# Patient Record
Sex: Female | Born: 1970 | Race: White | Hispanic: No | Marital: Married | Smoking: Former smoker
Health system: Southern US, Community
[De-identification: ages and names within clinical notes are randomized; demographics above are authoritative.]

## PROBLEM LIST (undated history)

## (undated) DIAGNOSIS — M1990 Unspecified osteoarthritis, unspecified site: Secondary | ICD-10-CM

## (undated) DIAGNOSIS — T7840XA Allergy, unspecified, initial encounter: Secondary | ICD-10-CM

## (undated) DIAGNOSIS — K589 Irritable bowel syndrome without diarrhea: Secondary | ICD-10-CM

## (undated) DIAGNOSIS — M3500 Sicca syndrome, unspecified: Secondary | ICD-10-CM

## (undated) DIAGNOSIS — F329 Major depressive disorder, single episode, unspecified: Secondary | ICD-10-CM

## (undated) DIAGNOSIS — E669 Obesity, unspecified: Secondary | ICD-10-CM

## (undated) DIAGNOSIS — F419 Anxiety disorder, unspecified: Secondary | ICD-10-CM

## (undated) DIAGNOSIS — K219 Gastro-esophageal reflux disease without esophagitis: Secondary | ICD-10-CM

## (undated) DIAGNOSIS — Z789 Other specified health status: Secondary | ICD-10-CM

## (undated) DIAGNOSIS — J45909 Unspecified asthma, uncomplicated: Secondary | ICD-10-CM

## (undated) DIAGNOSIS — G8929 Other chronic pain: Secondary | ICD-10-CM

## (undated) DIAGNOSIS — F32A Depression, unspecified: Secondary | ICD-10-CM

## (undated) DIAGNOSIS — R519 Headache, unspecified: Secondary | ICD-10-CM

## (undated) DIAGNOSIS — R51 Headache: Secondary | ICD-10-CM

## (undated) HISTORY — DX: Unspecified asthma, uncomplicated: J45.909

## (undated) HISTORY — DX: Headache: R51

## (undated) HISTORY — DX: Unspecified osteoarthritis, unspecified site: M19.90

## (undated) HISTORY — DX: Obesity, unspecified: E66.9

## (undated) HISTORY — PX: PLANTAR FASCIA SURGERY: SHX746

## (undated) HISTORY — DX: Headache, unspecified: R51.9

## (undated) HISTORY — DX: Other chronic pain: G89.29

## (undated) HISTORY — DX: Major depressive disorder, single episode, unspecified: F32.9

## (undated) HISTORY — DX: Gastro-esophageal reflux disease without esophagitis: K21.9

## (undated) HISTORY — DX: Sjogren syndrome, unspecified: M35.00

## (undated) HISTORY — DX: Anxiety disorder, unspecified: F41.9

## (undated) HISTORY — DX: Depression, unspecified: F32.A

## (undated) HISTORY — DX: Irritable bowel syndrome, unspecified: K58.9

## (undated) HISTORY — PX: WISDOM TOOTH EXTRACTION: SHX21

## (undated) HISTORY — DX: Allergy, unspecified, initial encounter: T78.40XA

---

## 1999-02-19 ENCOUNTER — Other Ambulatory Visit: Admission: RE | Admit: 1999-02-19 | Discharge: 1999-02-19 | Payer: Self-pay | Admitting: *Deleted

## 2003-09-10 ENCOUNTER — Other Ambulatory Visit: Admission: RE | Admit: 2003-09-10 | Discharge: 2003-09-10 | Payer: Self-pay | Admitting: Obstetrics and Gynecology

## 2004-12-21 HISTORY — PX: FOOT SURGERY: SHX648

## 2005-06-04 ENCOUNTER — Encounter: Admission: RE | Admit: 2005-06-04 | Discharge: 2005-06-04 | Payer: Self-pay | Admitting: Otolaryngology

## 2005-09-30 ENCOUNTER — Other Ambulatory Visit: Admission: RE | Admit: 2005-09-30 | Discharge: 2005-09-30 | Payer: Self-pay | Admitting: Obstetrics and Gynecology

## 2007-11-19 ENCOUNTER — Emergency Department (HOSPITAL_COMMUNITY): Admission: EM | Admit: 2007-11-19 | Discharge: 2007-11-19 | Payer: Self-pay | Admitting: Emergency Medicine

## 2008-04-25 ENCOUNTER — Encounter: Admission: RE | Admit: 2008-04-25 | Discharge: 2008-04-25 | Payer: Self-pay | Admitting: Specialist

## 2011-01-07 ENCOUNTER — Encounter
Admission: RE | Admit: 2011-01-07 | Discharge: 2011-01-07 | Payer: Self-pay | Source: Home / Self Care | Attending: Obstetrics and Gynecology | Admitting: Obstetrics and Gynecology

## 2011-01-07 IMAGING — MG MM DIGITAL DIAGNOSTIC UNILAT L
2 series · 2 of 2 positions shown · non-contrast
Comparison: [DATE]

CLINICAL DATA: Further evaluation of left asymmetry

DIGITAL DIAGNOSTIC LEFT MAMMOGRAM

[L CC]
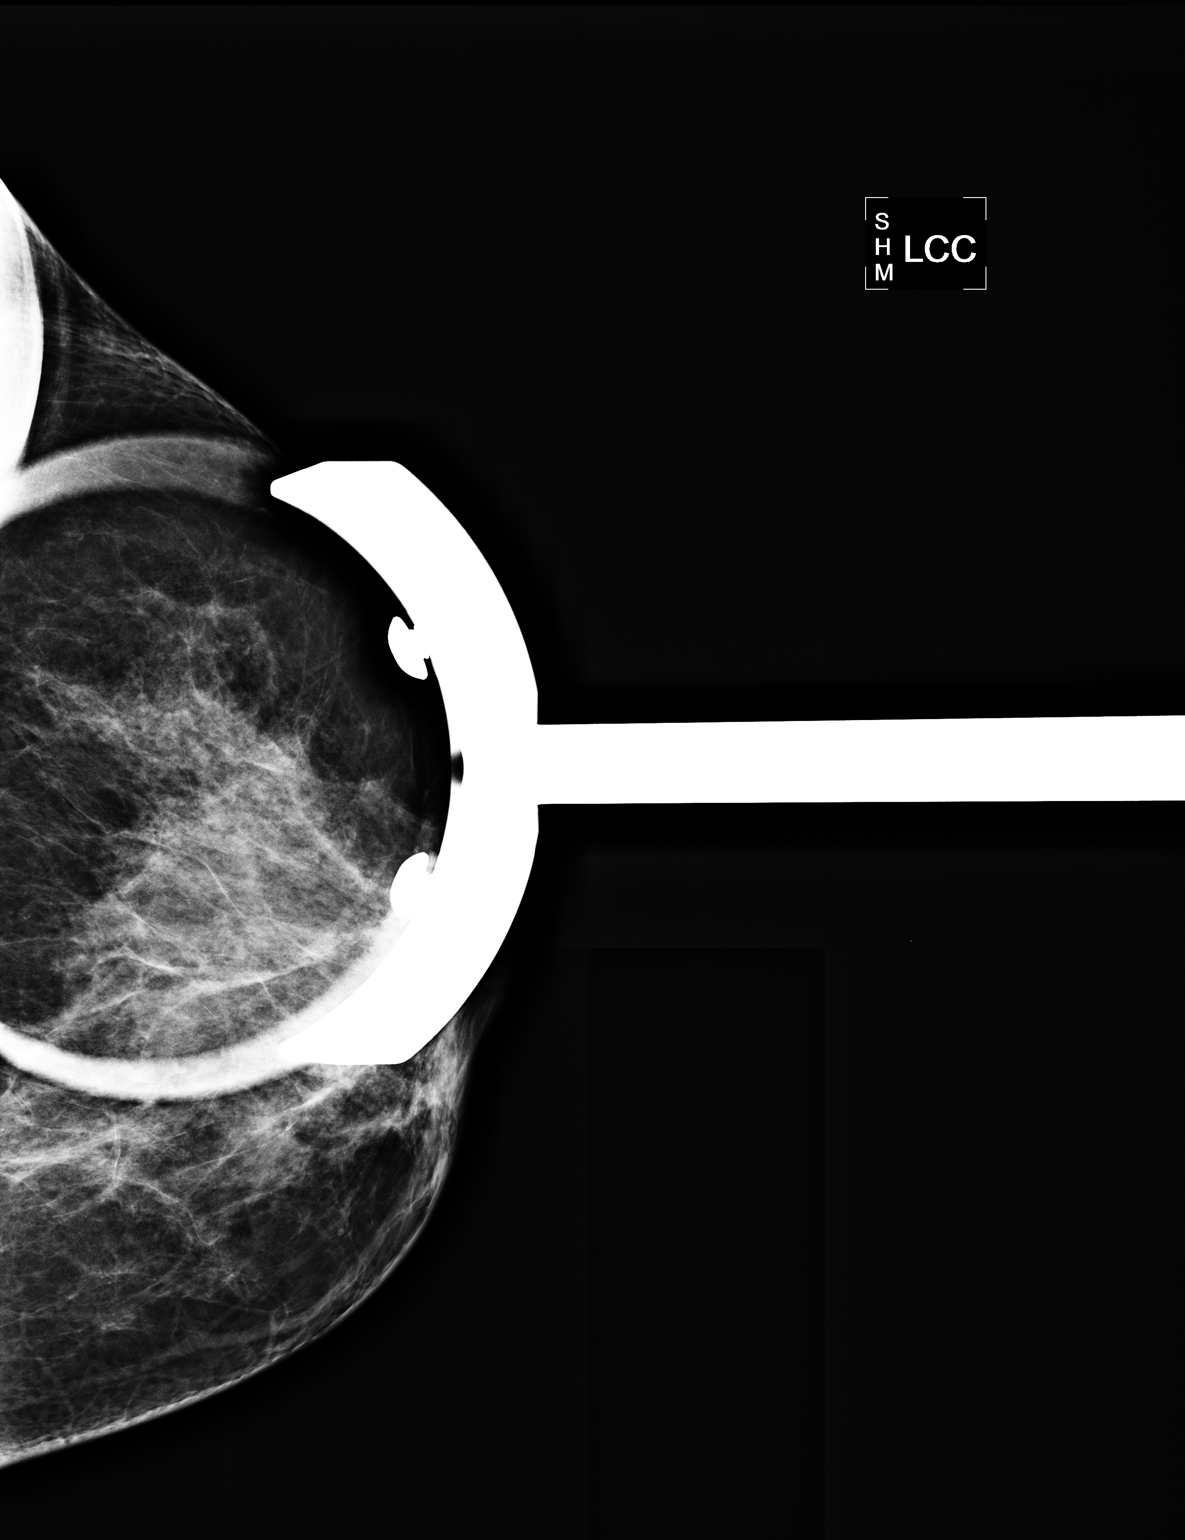

[L MLO]
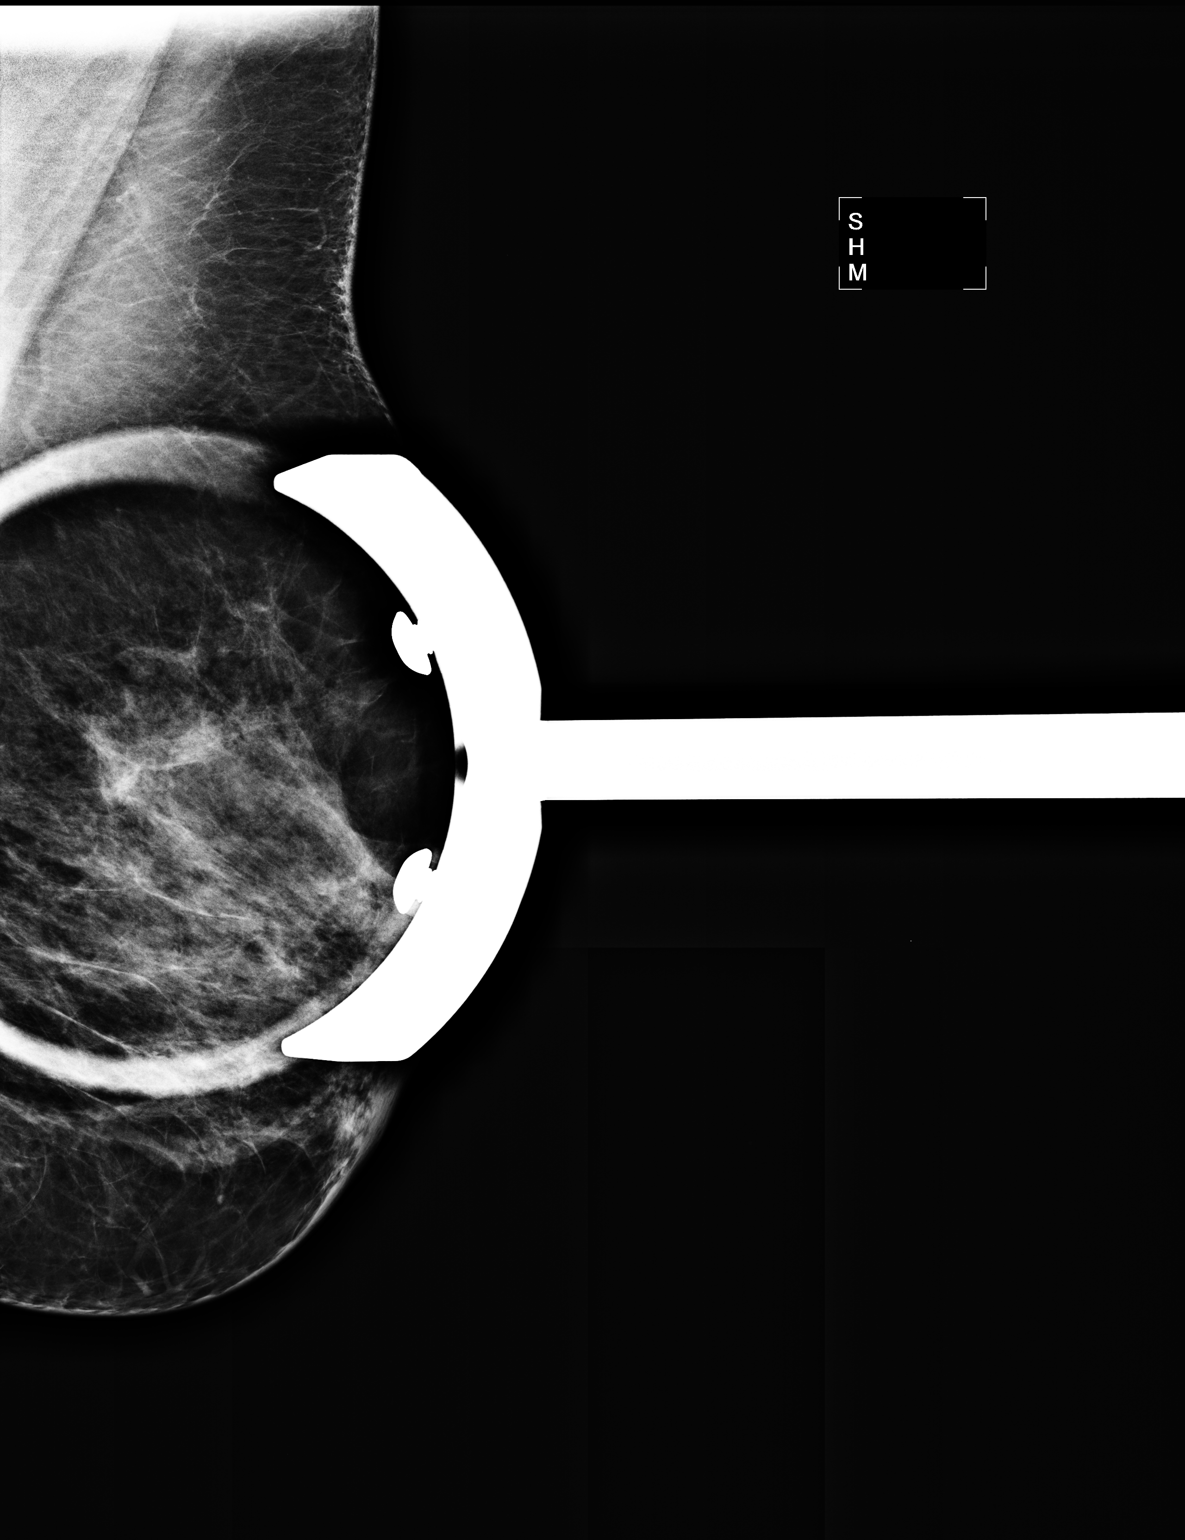

[2 of 2 positions shown; findings below may reference images not displayed]

FINDINGS: No persistent abnormality on spot compression views
IMPRESSION: Negative

BI-RADS CATEGORY 1:  Negative.

Recommendation:

Return to annual screening mammography.

## 2011-01-10 ENCOUNTER — Encounter: Payer: Self-pay | Admitting: Family Medicine

## 2011-01-21 ENCOUNTER — Encounter: Payer: Self-pay | Admitting: Obstetrics and Gynecology

## 2011-09-29 LAB — POCT CARDIAC MARKERS
CKMB, poc: <1 — ABNORMAL LOW
Myoglobin, poc: 54.4
Operator id: 151321

## 2011-09-29 LAB — CBC
HCT: 44.8
Hemoglobin: 15.1 — ABNORMAL HIGH
MCHC: 33.7
MCV: 90.6
Platelets: 294
RBC: 4.94
RDW: 13.8
WBC: 13 — ABNORMAL HIGH

## 2011-09-29 LAB — DIFFERENTIAL
Basophils Absolute: 0
Basophils Relative: 0
Eosinophils Relative: 1
Monocytes Absolute: 0.8

## 2011-09-29 LAB — I-STAT 8, (EC8 V) (CONVERTED LAB)
Acid-Base Excess: 1
Chloride: 103
TCO2: 28
pCO2, Ven: 43.4 — ABNORMAL LOW
pH, Ven: 7.397 — ABNORMAL HIGH

## 2011-09-29 LAB — POCT I-STAT CREATININE: Operator id: 151321

## 2014-04-11 ENCOUNTER — Observation Stay (HOSPITAL_COMMUNITY)
Admission: EM | Admit: 2014-04-11 | Discharge: 2014-04-14 | Disposition: A | Discharge status: Home / Self Care | Payer: BC Managed Care – PPO | Attending: Surgery | Admitting: Surgery

## 2014-04-11 ENCOUNTER — Encounter (HOSPITAL_COMMUNITY): Payer: Self-pay | Admitting: Emergency Medicine

## 2014-04-11 ENCOUNTER — Emergency Department (HOSPITAL_COMMUNITY)
Admission: EM | Admit: 2014-04-11 | Discharge: 2014-04-11 | Disposition: A | Discharge status: Nursing Home (Includes ICF) | Payer: BC Managed Care – PPO | Source: Home / Self Care | Attending: Emergency Medicine | Admitting: Emergency Medicine

## 2014-04-11 DIAGNOSIS — K810 Acute cholecystitis: Secondary | ICD-10-CM

## 2014-04-11 DIAGNOSIS — F17200 Nicotine dependence, unspecified, uncomplicated: Secondary | ICD-10-CM | POA: Insufficient documentation

## 2014-04-11 DIAGNOSIS — K8000 Calculus of gallbladder with acute cholecystitis without obstruction: Principal | ICD-10-CM | POA: Insufficient documentation

## 2014-04-11 DIAGNOSIS — R109 Unspecified abdominal pain: Secondary | ICD-10-CM

## 2014-04-11 DIAGNOSIS — K8010 Calculus of gallbladder with chronic cholecystitis without obstruction: Secondary | ICD-10-CM | POA: Diagnosis present

## 2014-04-11 HISTORY — DX: Other specified health status: Z78.9

## 2014-04-11 LAB — POCT PREGNANCY, URINE: PREG TEST UR: NEGATIVE

## 2014-04-11 LAB — CBC WITH DIFFERENTIAL/PLATELET
Basophils Absolute: 0.1 10*3/uL (ref 0.0–0.1)
Basophils Relative: 1 % (ref 0–1)
Eosinophils Absolute: 0.3 10*3/uL (ref 0.0–0.7)
Eosinophils Relative: 3 % (ref 0–5)
HEMATOCRIT: 43.7 % (ref 36.0–46.0)
HEMOGLOBIN: 15.1 g/dL — AB (ref 12.0–15.0)
LYMPHS ABS: 3 10*3/uL (ref 0.7–4.0)
LYMPHS PCT: 27 % (ref 12–46)
MCH: 31.5 pg (ref 26.0–34.0)
MCHC: 34.6 g/dL (ref 30.0–36.0)
MCV: 91.2 fL (ref 78.0–100.0)
MONO ABS: 0.7 10*3/uL (ref 0.1–1.0)
MONOS PCT: 6 % (ref 3–12)
NEUTROS ABS: 6.8 10*3/uL (ref 1.7–7.7)
Neutrophils Relative %: 63 % (ref 43–77)
Platelets: 264 10*3/uL (ref 150–400)
RBC: 4.79 MIL/uL (ref 3.87–5.11)
RDW: 13.1 % (ref 11.5–15.5)
WBC: 10.8 10*3/uL — AB (ref 4.0–10.5)

## 2014-04-11 LAB — POCT URINALYSIS DIP (DEVICE)
Bilirubin Urine: NEGATIVE
GLUCOSE, UA: NEGATIVE mg/dL
HGB URINE DIPSTICK: NEGATIVE
KETONES UR: NEGATIVE mg/dL
Leukocytes, UA: NEGATIVE
Nitrite: NEGATIVE
PROTEIN: NEGATIVE mg/dL
SPECIFIC GRAVITY, URINE: 1.01 (ref 1.005–1.030)
Urobilinogen, UA: 0.2 mg/dL (ref 0.0–1.0)
pH: 6 (ref 5.0–8.0)

## 2014-04-11 LAB — COMPREHENSIVE METABOLIC PANEL
ALT: 30 U/L (ref 0–35)
AST: 20 U/L (ref 0–37)
Albumin: 4.2 g/dL (ref 3.5–5.2)
Alkaline Phosphatase: 71 U/L (ref 39–117)
BILIRUBIN TOTAL: 0.3 mg/dL (ref 0.3–1.2)
BUN: 12 mg/dL (ref 6–23)
CHLORIDE: 102 meq/L (ref 96–112)
CO2: 24 meq/L (ref 19–32)
CREATININE: 0.84 mg/dL (ref 0.50–1.10)
Calcium: 9.5 mg/dL (ref 8.4–10.5)
GFR, EST NON AFRICAN AMERICAN: 83 mL/min — AB (ref >90)
GLUCOSE: 89 mg/dL (ref 70–99)
Potassium: 4.4 mEq/L (ref 3.7–5.3)
Sodium: 140 mEq/L (ref 137–147)
Total Protein: 7.6 g/dL (ref 6.0–8.3)

## 2014-04-11 LAB — LIPASE, BLOOD: LIPASE: 63 U/L — AB (ref 11–59)

## 2014-04-11 NOTE — ED Notes (Signed)
C/o RUQ abdominal pain that woke her up from sleep @ 0500.  Pain 8/10. Has had nausea all day but no vomiting.  Has had diarrhea off and on for 2-3 mos., after eating.  States she has 2-3 loose stools/day 5 out of 7 days /week.  No PCP.  States her stools are a light brown- or reddish clay color.  Took Maalox 2 tbls. without relief.  The sharp pain lasted 3 hrs. and now feels aching and bloating.

## 2014-04-11 NOTE — ED Notes (Signed)
Pt. 's husband is coming but not here yet.  Will direct him to the ED.

## 2014-04-11 NOTE — ED Notes (Signed)
Pt. reports generalized abdominal pain with diarrhea , lower back and nausea onset today , no fever or chills, urinalysis / preg test done at [facility] urgent care this evening .

## 2014-04-11 NOTE — ED Provider Notes (Signed)
CSN: [id number]     Arrival date & time 04/11/14  1855 History   First MD Initiated Contact with Patient 04/11/14 2003     Chief Complaint  Patient presents with  . Abdominal Pain   (Consider location/radiation/quality/duration/timing/severity/associated sxs/prior Treatment) Patient is a 43 y.o. female presenting with abdominal pain. The history is provided by the patient.  Abdominal Pain Pain location:  RUQ Pain quality: sharp   Pain quality comment:  Patient states discomofrt woke her from sleep this morning. Pain radiates to:  Back Pain severity:  Moderate Onset quality:  Sudden Duration:  1 day Timing:  Constant Chronicity:  New Context: awakening from sleep   Context: not alcohol use, not diet changes, not eating, not previous surgeries, not recent illness, not recent travel, not suspicious food intake and not trauma   Relieved by:  Nothing Worsened by:  Eating Associated symptoms: anorexia, diarrhea and nausea   Associated symptoms: no belching, no chills, no constipation, no cough, no dysuria, no fatigue, no fever, no flatus, no hematemesis, no hematochezia, no hematuria, no melena, no shortness of breath, no sore throat, no vaginal bleeding, no vaginal discharge and no vomiting     History reviewed. No pertinent past medical history. Past Surgical History  Procedure Laterality Date  . Foot surgery Left 2006    fascia tendon clipped   History reviewed. No pertinent family history. History  Substance Use Topics  . Smoking status: Current Every Day Smoker -- 0.50 packs/day    Types: Cigarettes  . Smokeless tobacco: Not on file  . Alcohol Use: 2.4 oz/week    4 Shots of liquor per week   OB History   Grav Para Term Preterm Abortions TAB SAB Ect Mult Living                 Review of Systems  Constitutional: Positive for appetite change. Negative for fever, chills and fatigue.  HENT: Negative.  Negative for sore throat.   Eyes: Negative.   Respiratory: Negative for  cough, chest tightness and shortness of breath.   Cardiovascular: Negative.   Gastrointestinal: Positive for nausea, abdominal pain, diarrhea, abdominal distention and anorexia. Negative for vomiting, constipation, blood in stool, melena, hematochezia, rectal pain, flatus and hematemesis.  Genitourinary: Negative.  Negative for dysuria, hematuria, vaginal bleeding and vaginal discharge.  Musculoskeletal: Positive for back pain.  Skin: Negative.   Neurological: Negative.     Allergies  Review of patient's allergies indicates no known allergies.  Home Medications   Prior to Admission medications   Not on File   BP 113/77  Pulse 80  Temp(Src) 98.5 F (36.9 C) (Oral)  Resp 19  SpO2 96%  LMP 02/23/2014 Physical Exam  Nursing note and vitals reviewed. Constitutional: She is oriented to person, place, and time. She appears well-developed and well-nourished. No distress.  HENT:  Head: Normocephalic and atraumatic.  Eyes: Conjunctivae are normal. No scleral icterus.  Cardiovascular: Normal rate, regular rhythm and normal heart sounds.   Pulmonary/Chest: Effort normal and breath sounds normal.  Abdominal: Soft. Normal appearance. Bowel sounds are decreased. There is no hepatosplenomegaly. There is tenderness in the right upper quadrant. There is positive Murphy's sign. There is no rigidity, no rebound, no guarding, no CVA tenderness and no tenderness at McBurney's point.  Musculoskeletal: Normal range of motion.  Neurological: She is alert and oriented to person, place, and time.  Skin: Skin is warm and dry. No rash noted.  Psychiatric: She has a normal mood and affect. Her  behavior is normal.    ED Course  Procedures (including critical care time) Labs Review Labs Reviewed  POCT URINALYSIS DIP (DEVICE)  POCT PREGNANCY, URINE   Imaging Review No results found.   MDM   1. Abdominal pain    Exam suggestive of acute cholelithiasis/cholecystitis. UA normal and UPT negative.  Will transfer to [facility] ER for possible abdominal U/S imaging and further evaluation.     [place], [place] 04/11/14 [phone]

## 2014-04-12 ENCOUNTER — Encounter (HOSPITAL_COMMUNITY)
Admission: EM | Disposition: A | Discharge status: Home / Self Care | Payer: Self-pay | Source: Home / Self Care | Attending: Emergency Medicine

## 2014-04-12 ENCOUNTER — Observation Stay (HOSPITAL_COMMUNITY): Payer: BC Managed Care – PPO | Admitting: Anesthesiology

## 2014-04-12 ENCOUNTER — Encounter (HOSPITAL_COMMUNITY): Payer: BC Managed Care – PPO | Admitting: Anesthesiology

## 2014-04-12 ENCOUNTER — Encounter (HOSPITAL_COMMUNITY): Payer: Self-pay | Admitting: *Deleted

## 2014-04-12 ENCOUNTER — Emergency Department (HOSPITAL_COMMUNITY): Payer: BC Managed Care – PPO

## 2014-04-12 DIAGNOSIS — K8010 Calculus of gallbladder with chronic cholecystitis without obstruction: Secondary | ICD-10-CM | POA: Diagnosis present

## 2014-04-12 DIAGNOSIS — K8020 Calculus of gallbladder without cholecystitis without obstruction: Secondary | ICD-10-CM

## 2014-04-12 HISTORY — PX: CHOLECYSTECTOMY: SHX55

## 2014-04-12 LAB — SURGICAL PCR SCREEN
MRSA, PCR: NEGATIVE
Staphylococcus aureus: NEGATIVE

## 2014-04-12 SURGERY — LAPAROSCOPIC CHOLECYSTECTOMY WITH INTRAOPERATIVE CHOLANGIOGRAM
Anesthesia: General | Site: Abdomen

## 2014-04-12 MED ORDER — LIDOCAINE HCL (CARDIAC) 20 MG/ML IV SOLN
INTRAVENOUS | Status: DC | PRN
  Administered 2014-04-12: 50 mg via INTRAVENOUS

## 2014-04-12 MED ORDER — BUPIVACAINE-EPINEPHRINE (PF) 0.25% -1:200000 IJ SOLN
INTRAMUSCULAR | Status: AC
  Filled 2014-04-12: qty 30

## 2014-04-12 MED ORDER — ARTIFICIAL TEARS OP OINT
TOPICAL_OINTMENT | OPHTHALMIC | Status: AC
  Filled 2014-04-12: qty 3.5

## 2014-04-12 MED ORDER — DEXAMETHASONE SODIUM PHOSPHATE 4 MG/ML IJ SOLN
INTRAMUSCULAR | Status: DC | PRN
  Administered 2014-04-12: 8 mg via INTRAVENOUS

## 2014-04-12 MED ORDER — CEFAZOLIN SODIUM-DEXTROSE 2-3 GM-% IV SOLR
INTRAVENOUS | Status: DC | PRN
  Administered 2014-04-12: 2 g via INTRAVENOUS

## 2014-04-12 MED ORDER — OXYCODONE-ACETAMINOPHEN 5-325 MG PO TABS
1.0000 | ORAL_TABLET | ORAL | Status: DC | PRN
  Administered 2014-04-13 – 2014-04-14 (×5): 2 via ORAL
  Filled 2014-04-12 (×5): qty 2

## 2014-04-12 MED ORDER — ARTIFICIAL TEARS OP OINT
TOPICAL_OINTMENT | OPHTHALMIC | Status: DC | PRN
  Administered 2014-04-12: 1 via OPHTHALMIC

## 2014-04-12 MED ORDER — ONDANSETRON HCL 4 MG/2ML IJ SOLN
4.0000 mg | Freq: Four times a day (QID) | INTRAMUSCULAR | Status: DC | PRN
  Administered 2014-04-12: 4 mg via INTRAVENOUS
  Filled 2014-04-12: qty 2

## 2014-04-12 MED ORDER — KETOROLAC TROMETHAMINE 30 MG/ML IJ SOLN
INTRAMUSCULAR | Status: DC | PRN
  Administered 2014-04-12: 30 mg via INTRAVENOUS

## 2014-04-12 MED ORDER — FENTANYL CITRATE 0.05 MG/ML IJ SOLN
INTRAMUSCULAR | Status: AC
  Filled 2014-04-12: qty 5

## 2014-04-12 MED ORDER — ROCURONIUM BROMIDE 50 MG/5ML IV SOLN
INTRAVENOUS | Status: AC
  Filled 2014-04-12: qty 1

## 2014-04-12 MED ORDER — MIDAZOLAM HCL 2 MG/2ML IJ SOLN
INTRAMUSCULAR | Status: AC
  Filled 2014-04-12: qty 2

## 2014-04-12 MED ORDER — PROPOFOL 10 MG/ML IV BOLUS
INTRAVENOUS | Status: DC | PRN
  Administered 2014-04-12: 150 mg via INTRAVENOUS
  Administered 2014-04-12: 60 mg via INTRAVENOUS

## 2014-04-12 MED ORDER — OXYCODONE HCL 5 MG PO TABS: 5.0000 mg | ORAL_TABLET | Freq: Once | ORAL | Status: DC | PRN

## 2014-04-12 MED ORDER — LACTATED RINGERS IV SOLN
INTRAVENOUS | Status: DC | PRN
  Administered 2014-04-12 (×2): via INTRAVENOUS

## 2014-04-12 MED ORDER — HYDROMORPHONE HCL PF 1 MG/ML IJ SOLN
1.0000 mg | Freq: Once | INTRAMUSCULAR | Status: AC
  Administered 2014-04-12: 1 mg via INTRAMUSCULAR
  Filled 2014-04-12: qty 1

## 2014-04-12 MED ORDER — PROPOFOL 10 MG/ML IV BOLUS
INTRAVENOUS | Status: AC
  Filled 2014-04-12: qty 20

## 2014-04-12 MED ORDER — OXYCODONE HCL 5 MG/5ML PO SOLN: 5.0000 mg | Freq: Once | ORAL | Status: DC | PRN

## 2014-04-12 MED ORDER — BUPIVACAINE-EPINEPHRINE 0.25% -1:200000 IJ SOLN
INTRAMUSCULAR | Status: DC | PRN
  Administered 2014-04-12: 20 mL

## 2014-04-12 MED ORDER — HYDROMORPHONE HCL PF 1 MG/ML IJ SOLN
0.2500 mg | INTRAMUSCULAR | Status: DC | PRN
  Administered 2014-04-12 (×2): 0.5 mg via INTRAVENOUS

## 2014-04-12 MED ORDER — GLYCOPYRROLATE 0.2 MG/ML IJ SOLN
INTRAMUSCULAR | Status: AC
  Filled 2014-04-12: qty 2

## 2014-04-12 MED ORDER — MIDAZOLAM HCL 5 MG/5ML IJ SOLN
INTRAMUSCULAR | Status: DC | PRN
  Administered 2014-04-12: 2 mg via INTRAVENOUS

## 2014-04-12 MED ORDER — FENTANYL CITRATE 0.05 MG/ML IJ SOLN
INTRAMUSCULAR | Status: DC | PRN
  Administered 2014-04-12 (×3): 50 ug via INTRAVENOUS
  Administered 2014-04-12: 100 ug via INTRAVENOUS

## 2014-04-12 MED ORDER — ONDANSETRON HCL 4 MG/2ML IJ SOLN
INTRAMUSCULAR | Status: DC | PRN
  Administered 2014-04-12: 4 mg via INTRAVENOUS

## 2014-04-12 MED ORDER — PANTOPRAZOLE SODIUM 40 MG IV SOLR
40.0000 mg | Freq: Every day | INTRAVENOUS | Status: DC
  Administered 2014-04-12 – 2014-04-13 (×2): 40 mg via INTRAVENOUS
  Filled 2014-04-12 (×4): qty 40

## 2014-04-12 MED ORDER — MORPHINE SULFATE 2 MG/ML IJ SOLN
2.0000 mg | INTRAMUSCULAR | Status: DC | PRN
  Administered 2014-04-12 (×2): 2 mg via INTRAVENOUS
  Filled 2014-04-12 (×2): qty 1

## 2014-04-12 MED ORDER — KCL IN DEXTROSE-NACL 20-5-0.9 MEQ/L-%-% IV SOLN
INTRAVENOUS | Status: DC
  Administered 2014-04-12 – 2014-04-14 (×3): via INTRAVENOUS
  Filled 2014-04-12 (×4): qty 1000

## 2014-04-12 MED ORDER — SODIUM CHLORIDE 0.9 % IR SOLN
Status: DC | PRN
  Administered 2014-04-12: 1

## 2014-04-12 MED ORDER — MORPHINE SULFATE 2 MG/ML IJ SOLN
1.0000 mg | INTRAMUSCULAR | Status: DC | PRN
  Administered 2014-04-12 (×2): 4 mg via INTRAVENOUS
  Administered 2014-04-13 (×2): 2 mg via INTRAVENOUS
  Filled 2014-04-12: qty 1
  Filled 2014-04-12 (×2): qty 2
  Filled 2014-04-12: qty 1

## 2014-04-12 MED ORDER — ONDANSETRON HCL 4 MG/2ML IJ SOLN
INTRAMUSCULAR | Status: AC
  Filled 2014-04-12: qty 2

## 2014-04-12 MED ORDER — SUCCINYLCHOLINE CHLORIDE 20 MG/ML IJ SOLN
INTRAMUSCULAR | Status: DC | PRN
  Administered 2014-04-12: 120 mg via INTRAVENOUS

## 2014-04-12 MED ORDER — HYDROMORPHONE HCL PF 1 MG/ML IJ SOLN
INTRAMUSCULAR | Status: AC
  Filled 2014-04-12: qty 1

## 2014-04-12 MED ORDER — KCL IN DEXTROSE-NACL 20-5-0.9 MEQ/L-%-% IV SOLN
INTRAVENOUS | Status: DC
  Administered 2014-04-12: 04:00:00 via INTRAVENOUS
  Filled 2014-04-12 (×3): qty 1000

## 2014-04-12 MED ORDER — NEOSTIGMINE METHYLSULFATE 1 MG/ML IJ SOLN
INTRAMUSCULAR | Status: AC
  Filled 2014-04-12: qty 10

## 2014-04-12 MED ORDER — 0.9 % SODIUM CHLORIDE (POUR BTL) OPTIME
TOPICAL | Status: DC | PRN
Start: 2014-04-12 — End: 2014-04-12
  Administered 2014-04-12: 1000 mL

## 2014-04-12 MED ORDER — LACTATED RINGERS IV SOLN
INTRAVENOUS | Status: DC
  Administered 2014-04-12: 13:00:00 via INTRAVENOUS

## 2014-04-12 SURGICAL SUPPLY — 47 items
APL SKNCLS STERI-STRIP NONHPOA (GAUZE/BANDAGES/DRESSINGS) ×1
APPLIER CLIP 5 13 M/L LIGAMAX5 (MISCELLANEOUS) ×3
APR CLP MED LRG 5 ANG JAW (MISCELLANEOUS) ×1
BAG SPEC RTRVL LRG 6X4 10 (ENDOMECHANICALS) ×1
BANDAGE ADHESIVE 1X3 (GAUZE/BANDAGES/DRESSINGS) ×12 IMPLANT
BENZOIN TINCTURE PRP APPL 2/3 (GAUZE/BANDAGES/DRESSINGS) ×3 IMPLANT
BNDG ADH 5X3 H2O RPLNT NS (GAUZE/BANDAGES/DRESSINGS) ×1
BNDG COHESIVE 3X5 WHT NS (GAUZE/BANDAGES/DRESSINGS) ×2 IMPLANT
CANISTER SUCTION 2500CC (MISCELLANEOUS) ×3 IMPLANT
CHLORAPREP W/TINT 26ML (MISCELLANEOUS) ×3 IMPLANT
CLIP APPLIE 5 13 M/L LIGAMAX5 (MISCELLANEOUS) ×1 IMPLANT
CLOSURE STERI-STRIP 1/2X4 (GAUZE/BANDAGES/DRESSINGS) ×1
CLSR STERI-STRIP ANTIMIC 1/2X4 (GAUZE/BANDAGES/DRESSINGS) ×1 IMPLANT
COVER MAYO STAND STRL (DRAPES) IMPLANT
COVER SURGICAL LIGHT HANDLE (MISCELLANEOUS) ×3 IMPLANT
DECANTER SPIKE VIAL GLASS SM (MISCELLANEOUS) ×3 IMPLANT
DRAPE C-ARM 42X72 X-RAY (DRAPES) IMPLANT
DRAPE UTILITY 15X26 W/TAPE STR (DRAPE) ×6 IMPLANT
ELECT REM PT RETURN 9FT ADLT (ELECTROSURGICAL) ×3
ELECTRODE REM PT RTRN 9FT ADLT (ELECTROSURGICAL) ×1 IMPLANT
GLOVE BIO SURGEON STRL SZ7.5 (GLOVE) ×2 IMPLANT
GLOVE BIOGEL PI IND STRL 7.0 (GLOVE) IMPLANT
GLOVE BIOGEL PI IND STRL 7.5 (GLOVE) IMPLANT
GLOVE BIOGEL PI INDICATOR 7.0 (GLOVE) ×2
GLOVE BIOGEL PI INDICATOR 7.5 (GLOVE) ×2
GLOVE SURG SIGNA 7.5 PF LTX (GLOVE) ×3 IMPLANT
GLOVE SURG SS PI 7.0 STRL IVOR (GLOVE) ×2 IMPLANT
GOWN STRL REUS W/ TWL LRG LVL3 (GOWN DISPOSABLE) ×3 IMPLANT
GOWN STRL REUS W/ TWL XL LVL3 (GOWN DISPOSABLE) ×1 IMPLANT
GOWN STRL REUS W/TWL LRG LVL3 (GOWN DISPOSABLE) ×9
GOWN STRL REUS W/TWL XL LVL3 (GOWN DISPOSABLE) ×3
KIT BASIN OR (CUSTOM PROCEDURE TRAY) ×3 IMPLANT
KIT ROOM TURNOVER OR (KITS) ×3 IMPLANT
NS IRRIG 1000ML POUR BTL (IV SOLUTION) ×3 IMPLANT
PAD ARMBOARD 7.5X6 YLW CONV (MISCELLANEOUS) ×3 IMPLANT
POUCH SPECIMEN RETRIEVAL 10MM (ENDOMECHANICALS) ×2 IMPLANT
SCISSORS LAP 5X35 DISP (ENDOMECHANICALS) ×3 IMPLANT
SET CHOLANGIOGRAPH 5 50 .035 (SET/KITS/TRAYS/PACK) IMPLANT
SET IRRIG TUBING LAPAROSCOPIC (IRRIGATION / IRRIGATOR) ×3 IMPLANT
SLEEVE ENDOPATH XCEL 5M (ENDOMECHANICALS) ×6 IMPLANT
SPECIMEN JAR SMALL (MISCELLANEOUS) ×3 IMPLANT
SUT MON AB 4-0 PC3 18 (SUTURE) ×3 IMPLANT
TOWEL OR 17X24 6PK STRL BLUE (TOWEL DISPOSABLE) ×3 IMPLANT
TOWEL OR 17X26 10 PK STRL BLUE (TOWEL DISPOSABLE) ×3 IMPLANT
TRAY LAPAROSCOPIC (CUSTOM PROCEDURE TRAY) ×3 IMPLANT
TROCAR XCEL BLUNT TIP 100MML (ENDOMECHANICALS) ×3 IMPLANT
TROCAR XCEL NON-BLD 5MMX100MML (ENDOMECHANICALS) ×3 IMPLANT

## 2014-04-12 NOTE — Progress Notes (Signed)
Patient ID: Kerri Rodriguez, female   DOB: Dec 10, 1971, 43 y.o.   MRN: [id number]  Patient stable, cholelithiasis with cholecystitis  Plan lap chole with possible cholangiogram today  I discussed the risks which include but are not limited to bleeding, infection, injury to surrounding structures including the bile duct, bile leak, need to convert to an open procedure, etc. I also discussed post op recovery.  She understands and agrees to proceed.

## 2014-04-12 NOTE — Transfer of Care (Signed)
Immediate Anesthesia Transfer of Care Note  Patient: Kerri Rodriguez  Procedure(s) Performed: Procedure(s): LAPAROSCOPIC CHOLECYSTECTOMY (N/A)  Patient Location: PACU  Anesthesia Type:General  Level of Consciousness: awake, alert  and oriented  Airway & Oxygen Therapy: Patient Spontanous Breathing and Patient connected to face mask oxygen  Post-op Assessment: Report given to PACU RN  Post vital signs: Reviewed and stable  Complications: No apparent anesthesia complications

## 2014-04-12 NOTE — H&P (Signed)
Kerri Rodriguez is an 43 y.o. female.   Chief Complaint: abdominal pain HPI:  The pt is a 43 yo wf who presents with RUQ pain. She reports mild pain for the last 2 weeks. Pain became severe Wednesday morning. No nausea or vomiting. No fever.  History reviewed. No pertinent past medical history.  Past Surgical History  Procedure Laterality Date  . Foot surgery Left 2006    fascia tendon clipped    No family history on file. Social History:  reports that she has been smoking Cigarettes.  She has been smoking about 0.50 packs per day. She does not have any smokeless tobacco history on file. She reports that she drinks about 2.4 ounces of alcohol per week. She reports that she does not use illicit drugs.  Allergies: No Known Allergies   (Not in a hospital admission)  Results for orders placed during the hospital encounter of 04/11/14 (from the past 48 hour(s))  CBC WITH DIFFERENTIAL     Status: Abnormal   Collection Time    04/11/14  9:02 PM      Result Value Ref Range   WBC 10.8 (*) 4.0 - 10.5 K/uL   RBC 4.79  3.87 - 5.11 MIL/uL   Hemoglobin 15.1 (*) 12.0 - 15.0 g/dL   HCT 43.7  36.0 - 46.0 %   MCV 91.2  78.0 - 100.0 fL   MCH 31.5  26.0 - 34.0 pg   MCHC 34.6  30.0 - 36.0 g/dL   RDW 13.1  11.5 - 15.5 %   Platelets 264  150 - 400 K/uL   Neutrophils Relative % 63  43 - 77 %   Neutro Abs 6.8  1.7 - 7.7 K/uL   Lymphocytes Relative 27  12 - 46 %   Lymphs Abs 3.0  0.7 - 4.0 K/uL   Monocytes Relative 6  3 - 12 %   Monocytes Absolute 0.7  0.1 - 1.0 K/uL   Eosinophils Relative 3  0 - 5 %   Eosinophils Absolute 0.3  0.0 - 0.7 K/uL   Basophils Relative 1  0 - 1 %   Basophils Absolute 0.1  0.0 - 0.1 K/uL  COMPREHENSIVE METABOLIC PANEL     Status: Abnormal   Collection Time    04/11/14  9:02 PM      Result Value Ref Range   Sodium 140  137 - 147 mEq/L   Potassium 4.4  3.7 - 5.3 mEq/L   Chloride 102  96 - 112 mEq/L   CO2 24  19 - 32 mEq/L   Glucose, Bld 89  70 - 99 mg/dL   BUN 12   6 - 23 mg/dL   Creatinine, Ser 0.84  0.50 - 1.10 mg/dL   Calcium 9.5  8.4 - 10.5 mg/dL   Total Protein 7.6  6.0 - 8.3 g/dL   Albumin 4.2  3.5 - 5.2 g/dL   AST 20  0 - 37 U/L   Comment: HEMOLYSIS AT THIS LEVEL MAY AFFECT RESULT   ALT 30  0 - 35 U/L   Alkaline Phosphatase 71  39 - 117 U/L   Total Bilirubin 0.3  0.3 - 1.2 mg/dL   GFR calc non Af Amer 83 (*) >90 mL/min   GFR calc Af Amer >90  >90 mL/min   Comment: (NOTE)     The eGFR has been calculated using the CKD EPI equation.     This calculation has not been validated in all clinical situations.  eGFR's persistently <90 mL/min signify possible Chronic Kidney     Disease.  LIPASE, BLOOD     Status: Abnormal   Collection Time    04/11/14  9:02 PM      Result Value Ref Range   Lipase 63 (*) 11 - 59 U/L   US Abdomen Limited  04/12/2014   CLINICAL DATA:  Abdominal pain  EXAM: US ABDOMEN LIMITED - RIGHT UPPER QUADRANT  COMPARISON:  None.  FINDINGS: Gallbladder:  Small stones in the dependent portion of the gallbladder with mild gallbladder wall thickening. Murphy's sign is positive. Probable sludge in the gallbladder. Findings are nonspecific but consistent with acute cholecystitis in the appropriate clinical setting.  Common bile duct:  Diameter: 5.7 mm, normal  Liver:  Diffusely increased echotexture throughout the liver suggesting fatty infiltration. No focal lesions identified.  IMPRESSION: Cholelithiasis with gallbladder wall thickening and positive Murphy's sign. Changes suggest acute cholecystitis in the appropriate clinical setting. Diffuse fatty infiltration of the liver.   Electronically Signed   By: Lucienne Capers M.D.   On: 04/12/2014 01:19    Review of Systems  Constitutional: Negative.   HENT: Negative.   Eyes: Negative.   Respiratory: Negative.   Cardiovascular: Negative.   Gastrointestinal: Positive for abdominal pain.  Genitourinary: Negative.   Musculoskeletal: Negative.   Skin: Negative.   Neurological:  Negative.   Endo/Heme/Allergies: Negative.   Psychiatric/Behavioral: Negative.     Blood pressure 108/66, pulse 77, temperature 98.3 F (36.8 C), resp. rate 18, last menstrual period 02/23/2014, SpO2 98.00%. Physical Exam  Constitutional: She is oriented to person, place, and time. She appears well-developed and well-nourished.  HENT:  Head: Normocephalic and atraumatic.  Eyes: Conjunctivae and EOM are normal. Pupils are equal, round, and reactive to light.  Neck: Normal range of motion. Neck supple.  Cardiovascular: Normal rate, regular rhythm and normal heart sounds.   Respiratory: Effort normal and breath sounds normal.  GI: Soft. Bowel sounds are normal.  Tender in RUQ. No guarding or peritonitis  Musculoskeletal: Normal range of motion.  Neurological: She is alert and oriented to person, place, and time.  Skin: Skin is warm and dry.  Psychiatric: She has a normal mood and affect. Her behavior is normal.     Assessment/Plan The pt appears to have cholecystitis with cholelithiasis. Because of the risk of further painful episodes and pancreatitis I think she would benefit from having her gallbladder removed during this hospitalization. Will admit and plan for lap chole with ioc  [name] 04/12/2014, 2:08 AM

## 2014-04-12 NOTE — ED Provider Notes (Signed)
Medical screening examination/treatment/procedure(s) were performed by non-physician practitioner and as supervising physician I was immediately available for consultation/collaboration.   EKG Interpretation None       Varney Biles, MD 04/12/14 [phone]

## 2014-04-12 NOTE — Op Note (Signed)
Laparoscopic Cholecystectomy Procedure Note  Indications: This patient presents with symptomatic gallbladder disease and will undergo laparoscopic cholecystectomy.  Pre-operative Diagnosis: Calculus of gallbladder with acute cholecystitis, without mention of obstruction  Post-operative Diagnosis: Same  Surgeon: Harl Bowie   Assistants: 0  Anesthesia: General endotracheal anesthesia  ASA Class: 1  Procedure Details  The patient was seen again in the Holding Room. The risks, benefits, complications, treatment options, and expected outcomes were discussed with the patient. The possibilities of reaction to medication, pulmonary aspiration, perforation of viscus, bleeding, recurrent infection, finding a normal gallbladder, the need for additional procedures, failure to diagnose a condition, the possible need to convert to an open procedure, and creating a complication requiring transfusion or operation were discussed with the patient. The likelihood of improving the patient's symptoms with return to their baseline status is good.  The patient and/or family concurred with the proposed plan, giving informed consent. The site of surgery properly noted. The patient was taken to Operating Room, identified as Kerri Rodriguez and the procedure verified as Laparoscopic Cholecystectomy with Intraoperative Cholangiogram. A Time Out was held and the above information confirmed.  Prior to the induction of general anesthesia, antibiotic prophylaxis was administered. General endotracheal anesthesia was then administered and tolerated well. After the induction, the abdomen was prepped with Chloraprep and draped in sterile fashion. The patient was positioned in the supine position.  Local anesthetic agent was injected into the skin near the umbilicus and an incision made. We dissected down to the abdominal fascia with blunt dissection.  The fascia was incised vertically and we entered the peritoneal cavity  bluntly.  A pursestring suture of 0-Vicryl was placed around the fascial opening.  The Hasson cannula was inserted and secured with the stay suture.  Pneumoperitoneum was then created with CO2 and tolerated well without any adverse changes in the patient's vital signs. An 11-mm port was placed in the subxiphoid position.  Two 5-mm ports were placed in the right upper quadrant. All skin incisions were infiltrated with a local anesthetic agent before making the incision and placing the trocars.   We positioned the patient in reverse Trendelenburg, tilted slightly to the patient's left.  The gallbladder was identified, the fundus grasped and retracted cephalad. Adhesions were lysed bluntly and with the electrocautery where indicated, taking care not to injure any adjacent organs or viscus. The infundibulum was grasped and retracted laterally, exposing the peritoneum overlying the triangle of Calot. This was then divided and exposed in a blunt fashion. The cystic duct was clearly identified and bluntly dissected circumferentially. A critical view of the cystic duct and cystic artery was obtained.  The cystic duct was then ligated with clips and divided. The cystic artery was, dissected free, ligated with clips and divided as well.   The gallbladder was dissected from the liver bed in retrograde fashion with the electrocautery. The gallbladder was removed and placed in an Endocatch sac. The liver bed was irrigated and inspected. Hemostasis was achieved with the electrocautery. Copious irrigation was utilized and was repeatedly aspirated until clear.  The gallbladder and Endocatch sac were then removed through the umbilical port site.  The pursestring suture was used to close the umbilical fascia.    We again inspected the right upper quadrant for hemostasis.  Pneumoperitoneum was released as we removed the trocars.  4-0 Monocryl was used to close the skin.   Benzoin, steri-strips, and clean dressings were applied.  The patient was then extubated and brought to the recovery  room in stable condition. Instrument, sponge, and needle counts were correct at closure and at the conclusion of the case.   Findings: Cholecystitis with Cholelithiasis  Estimated Blood Loss: Minimal         Drains: 0         Specimens: Gallbladder           Complications: None; patient tolerated the procedure well.         Disposition: PACU - hemodynamically stable.         Condition: stable

## 2014-04-12 NOTE — Progress Notes (Signed)
UR completed 

## 2014-04-12 NOTE — Anesthesia Preprocedure Evaluation (Addendum)
Anesthesia Evaluation  Patient identified by MRN, date of birth, ID band Patient awake    Reviewed: Allergy & Precautions, H&P , NPO status , Patient's Chart, lab work & pertinent test results  Airway Mallampati: II TM Distance: >3 FB Neck ROM: Full    Dental no notable dental hx. (+) Teeth Intact, Dental Advisory Given   Pulmonary Current Smoker,  breath sounds clear to auscultation  Pulmonary exam normal       Cardiovascular negative cardio ROS  Rhythm:Regular Rate:Normal     Neuro/Psych negative neurological ROS  negative psych ROS   GI/Hepatic negative GI ROS, Neg liver ROS,   Endo/Other  negative endocrine ROS  Renal/GU negative Renal ROS  negative genitourinary   Musculoskeletal   Abdominal   Peds  Hematology negative hematology ROS (+)   Anesthesia Other Findings   Reproductive/Obstetrics negative OB ROS                          Anesthesia Physical Anesthesia Plan  ASA: II  Anesthesia Plan: General   Post-op Pain Management:    Induction: Intravenous  Airway Management Planned: Oral ETT  Additional Equipment:   Intra-op Plan:   Post-operative Plan: Extubation in OR  Informed Consent: I have reviewed the patients History and Physical, chart, labs and discussed the procedure including the risks, benefits and alternatives for the proposed anesthesia with the patient or authorized representative who has indicated his/her understanding and acceptance.   Dental advisory given  Plan Discussed with: CRNA  Anesthesia Plan Comments:         Anesthesia Quick Evaluation

## 2014-04-12 NOTE — ED Provider Notes (Signed)
CSN: [id number]     Arrival date & time 04/11/14  2051 History   First MD Initiated Contact with Patient 04/11/14 2337     Chief Complaint  Patient presents with  . Abdominal Pain  . Diarrhea     (Consider location/radiation/quality/duration/timing/severity/associated sxs/prior Treatment) Patient is a 43 y.o. female presenting with abdominal pain and diarrhea. The history is provided by the patient. No language interpreter was used.  Abdominal Pain Pain location:  RUQ Pain quality: cramping and sharp   Pain severity:  Moderate Onset quality:  Gradual Associated symptoms: diarrhea and nausea   Associated symptoms: no chest pain, no chills, no cough, no dysuria, no fever, no shortness of breath and no vomiting   Associated symptoms comment:  Patient with RUQ significant abdominal pain starting this morning and associated with nausea, and non-bloody diarrhea. No vomiting or fever. She reports similar pain in the past but never this severe. She denies cough or chest pain. There is some radiation of pain into the back.  Diarrhea Associated symptoms: abdominal pain   Associated symptoms: no chills, no fever and no vomiting     History reviewed. No pertinent past medical history. Past Surgical History  Procedure Laterality Date  . Foot surgery Left 2006    fascia tendon clipped   No family history on file. History  Substance Use Topics  . Smoking status: Current Every Day Smoker -- 0.50 packs/day    Types: Cigarettes  . Smokeless tobacco: Not on file  . Alcohol Use: 2.4 oz/week    4 Shots of liquor per week   OB History   Grav Para Term Preterm Abortions TAB SAB Ect Mult Living                 Review of Systems  Constitutional: Negative for fever and chills.  Respiratory: Negative.  Negative for cough and shortness of breath.   Cardiovascular: Negative.  Negative for chest pain.  Gastrointestinal: Positive for nausea, abdominal pain and diarrhea. Negative for vomiting.   Genitourinary: Negative.  Negative for dysuria.  Musculoskeletal: Negative.   Skin: Negative.   Neurological: Negative.       Allergies  Review of patient's allergies indicates no known allergies.  Home Medications   Prior to Admission medications   Medication Sig Start Date End Date Taking? Authorizing Provider  ALPRAZolam Duanne Moron) 0.5 MG tablet Take 0.5 mg by mouth daily as needed for anxiety.   Yes Historical Provider, MD   BP 108/66  Pulse 77  Temp(Src) 98.3 F (36.8 C)  Resp 18  SpO2 98%  LMP 02/23/2014 Physical Exam  Constitutional: She is oriented to person, place, and time. She appears well-developed and well-nourished.  HENT:  Head: Normocephalic.  Neck: Normal range of motion. Neck supple.  Cardiovascular: Normal rate and regular rhythm.   Pulmonary/Chest: Effort normal and breath sounds normal.  Abdominal: Soft. Bowel sounds are normal. There is tenderness. There is no rebound and no guarding.  Tenderness limited to RUQ. Soft abdomen.  Musculoskeletal: Normal range of motion.  Neurological: She is alert and oriented to person, place, and time.  Skin: Skin is warm and dry. No rash noted.  Psychiatric: She has a normal mood and affect.    ED Course  Procedures (including critical care time) Labs Review Labs Reviewed  CBC WITH DIFFERENTIAL - Abnormal; Notable for the following:    WBC 10.8 (*)    Hemoglobin 15.1 (*)    All other components within normal limits  COMPREHENSIVE METABOLIC  PANEL - Abnormal; Notable for the following:    GFR calc non Af Amer 83 (*)    All other components within normal limits  LIPASE, BLOOD - Abnormal; Notable for the following:    Lipase 63 (*)    All other components within normal limits   Results for orders placed during the hospital encounter of 04/11/14  CBC WITH DIFFERENTIAL      Result Value Ref Range   WBC 10.8 (*) 4.0 - 10.5 K/uL   RBC 4.79  3.87 - 5.11 MIL/uL   Hemoglobin 15.1 (*) 12.0 - 15.0 g/dL   HCT 43.7   36.0 - 46.0 %   MCV 91.2  78.0 - 100.0 fL   MCH 31.5  26.0 - 34.0 pg   MCHC 34.6  30.0 - 36.0 g/dL   RDW 13.1  11.5 - 15.5 %   Platelets 264  150 - 400 K/uL   Neutrophils Relative % 63  43 - 77 %   Neutro Abs 6.8  1.7 - 7.7 K/uL   Lymphocytes Relative 27  12 - 46 %   Lymphs Abs 3.0  0.7 - 4.0 K/uL   Monocytes Relative 6  3 - 12 %   Monocytes Absolute 0.7  0.1 - 1.0 K/uL   Eosinophils Relative 3  0 - 5 %   Eosinophils Absolute 0.3  0.0 - 0.7 K/uL   Basophils Relative 1  0 - 1 %   Basophils Absolute 0.1  0.0 - 0.1 K/uL  COMPREHENSIVE METABOLIC PANEL      Result Value Ref Range   Sodium 140  137 - 147 mEq/L   Potassium 4.4  3.7 - 5.3 mEq/L   Chloride 102  96 - 112 mEq/L   CO2 24  19 - 32 mEq/L   Glucose, Bld 89  70 - 99 mg/dL   BUN 12  6 - 23 mg/dL   Creatinine, Ser 0.84  0.50 - 1.10 mg/dL   Calcium 9.5  8.4 - 10.5 mg/dL   Total Protein 7.6  6.0 - 8.3 g/dL   Albumin 4.2  3.5 - 5.2 g/dL   AST 20  0 - 37 U/L   ALT 30  0 - 35 U/L   Alkaline Phosphatase 71  39 - 117 U/L   Total Bilirubin 0.3  0.3 - 1.2 mg/dL   GFR calc non Af Amer 83 (*) >90 mL/min   GFR calc Af Amer >90  >90 mL/min  LIPASE, BLOOD      Result Value Ref Range   Lipase 63 (*) 11 - 59 U/L     Imaging Review US Abdomen Limited  04/12/2014   CLINICAL DATA:  Abdominal pain  EXAM: US ABDOMEN LIMITED - RIGHT UPPER QUADRANT  COMPARISON:  None.  FINDINGS: Gallbladder:  Small stones in the dependent portion of the gallbladder with mild gallbladder wall thickening. Murphy's sign is positive. Probable sludge in the gallbladder. Findings are nonspecific but consistent with acute cholecystitis in the appropriate clinical setting.  Common bile duct:  Diameter: 5.7 mm, normal  Liver:  Diffusely increased echotexture throughout the liver suggesting fatty infiltration. No focal lesions identified.  IMPRESSION: Cholelithiasis with gallbladder wall thickening and positive Murphy's sign. Changes suggest acute cholecystitis in the  appropriate clinical setting. Diffuse fatty infiltration of the liver.   Electronically Signed   By: Lucienne Capers M.D.   On: 04/12/2014 01:19     EKG Interpretation None      MDM   Final diagnoses:  None    1. Cholecystitis  Evidence of cholecystitis on ultrasound. Discussed with Dr. Marlou Starks who will see patient in the ED. Patient's pain controlled. Patient and family updated on results and care plan. All questions answered.     Dewaine Oats, PA-C 04/12/14 0153

## 2014-04-13 NOTE — ED Provider Notes (Signed)
Medical screening examination/treatment/procedure(s) were performed by non-physician practitioner and as supervising physician I was immediately available for consultation/collaboration.  Philipp Deputy, M.D.   Harden Mo, MD 04/13/14 1328

## 2014-04-13 NOTE — Anesthesia Postprocedure Evaluation (Signed)
  Anesthesia Post-op Note  Patient: Kerri Rodriguez  Procedure(s) Performed: Procedure(s): LAPAROSCOPIC CHOLECYSTECTOMY (N/A)  Patient Location: PACU  Anesthesia Type:General  Level of Consciousness: awake and alert   Airway and Oxygen Therapy: Patient Spontanous Breathing  Post-op Pain: moderate  Post-op Assessment: Post-op Vital signs reviewed, Patient's Cardiovascular Status Stable and Respiratory Function Stable  Post-op Vital Signs: Reviewed  Filed Vitals:   04/13/14 1317  BP: 103/55  Pulse: 59  Temp: 36.7 C  Resp: 18    Complications: No apparent anesthesia complications

## 2014-04-13 NOTE — Progress Notes (Signed)
1 Day Post-Op  Subjective: Feeling a little bloated and sore  Objective: Vital signs in last 24 hours: Temp:  [97.6 F (36.4 C)-99.7 F (37.6 C)] 99.7 F (37.6 C) (04/24 0504) Pulse Rate:  [58-96] 61 (04/24 0504) Resp:  [9-17] 16 (04/24 0504) BP: (97-114)/(49-71) 105/49 mmHg (04/24 0504) SpO2:  [91 %-100 %] 93 % (04/24 0504) Last BM Date: 04/11/14  Intake/Output from previous day: 04/23 0701 - 04/24 0700 In: 1390 [P.O.:240; I.V.:1150] Out: 25 [Blood:25] Intake/Output this shift:    Abdomen soft but full, appropriately tender  Lab Results:   Recent Labs  04/11/14 2102  WBC 10.8*  HGB 15.1*  HCT 43.7  PLT 264   BMET  Recent Labs  04/11/14 2102  NA 140  K 4.4  CL 102  CO2 24  GLUCOSE 89  BUN 12  CREATININE 0.84  CALCIUM 9.5   PT/INR No results found for this basename: LABPROT, INR,  in the last 72 hours ABG No results found for this basename: PHART, PCO2, PO2, HCO3,  in the last 72 hours  Studies/Results: US Abdomen Limited  04/12/2014   CLINICAL DATA:  Abdominal pain  EXAM: US ABDOMEN LIMITED - RIGHT UPPER QUADRANT  COMPARISON:  None.  FINDINGS: Gallbladder:  Small stones in the dependent portion of the gallbladder with mild gallbladder wall thickening. Murphy's sign is positive. Probable sludge in the gallbladder. Findings are nonspecific but consistent with acute cholecystitis in the appropriate clinical setting.  Common bile duct:  Diameter: 5.7 mm, normal  Liver:  Diffusely increased echotexture throughout the liver suggesting fatty infiltration. No focal lesions identified.  IMPRESSION: Cholelithiasis with gallbladder wall thickening and positive Murphy's sign. Changes suggest acute cholecystitis in the appropriate clinical setting. Diffuse fatty infiltration of the liver.   Electronically Signed   By: Lucienne Capers M.D.   On: 04/12/2014 01:19    Anti-infectives: Anti-infectives   None      Assessment/Plan: s/p Procedure(s): LAPAROSCOPIC  CHOLECYSTECTOMY (N/A)  Keep on clears Home after lunch if improving  LOS: 2 days    Harl Bowie 04/13/2014

## 2014-04-13 NOTE — Progress Notes (Signed)
Pt complaining of sharp pain in upper abd after eating. Pain medication given and encouraged pt to resume liquid diet for now, will continue to monitor.

## 2014-04-14 MED ORDER — OXYCODONE-ACETAMINOPHEN 5-325 MG PO TABS: 1.0000 | ORAL_TABLET | ORAL | Status: DC | PRN

## 2014-04-14 NOTE — Discharge Instructions (Signed)

## 2014-04-14 NOTE — Discharge Summary (Signed)
Physician Discharge Summary  Kerri Rodriguez GLO:[id number] DOB: 08-31-1971 DOA: 04/11/2014  PCP: No PCP Per Patient  Consultation: none  Admit date: 04/11/2014 Discharge date: 04/14/2014  Recommendations for Outpatient Follow-up:    Follow-up Information   Follow up with [facility] On 05/08/2014. (arrive by 1:30PM for a 2PM appt for a post op check)    Contact information:   [address]   [place] [address] [phone]      Discharge Diagnoses:  1. Calculus of gallbladder with acute cholecystitis    Surgical Procedure: laparoscopic cholecystectomy---Dr. Ninfa Linden 04/12/14  Discharge Condition: stable Disposition: home  Diet recommendation: regular  Filed Weights   04/12/14 0342  Weight: 208 lb 12.4 oz (94.7 kg)    Filed Vitals:   04/14/14 0548  BP: 102/72  Pulse: 57  Temp: 97.8 F (36.6 C)  Resp: 21    Hospital Course:  Kerri Rodriguez presented to [facility] with abdominal pain.  She was found to have cholelithiasis with possible cholecystitis.  She ws admitted for a lap chole.  She tolerated the procedure well and was transferred to the floor.  On POD#1 the patient was having bloating and therefore kept overnight.  She remained stable.  On POD#2 the patient was tolerating a diet, ambulating, pain well controlled and therefore felt stable for discharge.  Warning signs that warrant immediate attention were discussed.  Medication side effects reviewee.  A follow up appt has been made on behalf of the patient.  She was encouraged to call with questions or concerns.   Physical Exam: General appearance: alert and oriented. Calm and cooperative  Resp: clear to auscultation bilaterally  Cardio: S1S1 RRR without murmurs or gallops. No edema. GI: soft, +bs, appropriately tender.  Incisions are c/d/i. +BS x4 quadrants. No organomegaly, hernias or masses.    Discharge Instructions   Future Appointments Provider Department Dept Phone   05/08/2014  2:00 PM [facility], [place] [phone]       Medication List         ALPRAZolam 0.5 MG tablet  Commonly known as:  XANAX  Take 0.5 mg by mouth daily as needed for anxiety.     oxyCODONE-acetaminophen 5-325 MG per tablet  Commonly known as:  PERCOCET/ROXICET  Take 1-2 tablets by mouth every 4 (four) hours as needed for moderate pain.           Follow-up Information   Follow up with [facility] On 05/08/2014. (arrive by 1:30PM for a 2PM appt for a post op check)    Contact information:   [address]   [place] [address] [phone]        The results of significant diagnostics from this hospitalization (including imaging, microbiology, ancillary and laboratory) are listed below for reference.    Significant Diagnostic Studies: US Abdomen Limited  04/12/2014   CLINICAL DATA:  Abdominal pain  EXAM: US ABDOMEN LIMITED - RIGHT UPPER QUADRANT  COMPARISON:  None.  FINDINGS: Gallbladder:  Small stones in the dependent portion of the gallbladder with mild gallbladder wall thickening. Murphy's sign is positive. Probable sludge in the gallbladder. Findings are nonspecific but consistent with acute cholecystitis in the appropriate clinical setting.  Common bile duct:  Diameter: 5.7 mm, normal  Liver:  Diffusely increased echotexture throughout the liver suggesting fatty infiltration. No focal lesions identified.  IMPRESSION: Cholelithiasis with gallbladder wall thickening and positive Murphy's  sign. Changes suggest acute cholecystitis in the appropriate clinical setting. Diffuse fatty infiltration of the liver.   Electronically Signed   By: Lucienne Capers M.D.   On: 04/12/2014 01:19    Microbiology: Recent Results (from the past 240 hour(s))  SURGICAL PCR SCREEN     Status: None   Collection Time    04/12/14  3:46 AM      Result Value Ref Range Status   MRSA, PCR NEGATIVE  NEGATIVE Final   Staphylococcus aureus NEGATIVE   NEGATIVE Final   Comment:            The Xpert SA Assay (FDA     approved for NASAL specimens     in patients over 3 years of age),     is one component of     a comprehensive surveillance     program.  Test performance has     been validated by [employer] for patients greater     than or equal to 72 year old.     It is not intended     to diagnose infection nor to     guide or monitor treatment.     Labs: Basic Metabolic Panel:  Recent Labs Lab 04/11/14 2102  NA 140  K 4.4  CL 102  CO2 24  GLUCOSE 89  BUN 12  CREATININE 0.84  CALCIUM 9.5   Liver Function Tests:  Recent Labs Lab 04/11/14 2102  AST 20  ALT 30  ALKPHOS 71  BILITOT 0.3  PROT 7.6  ALBUMIN 4.2    Recent Labs Lab 04/11/14 2102  LIPASE 63*   No results found for this basename: AMMONIA,  in the last 168 hours CBC:  Recent Labs Lab 04/11/14 2102  WBC 10.8*  NEUTROABS 6.8  HGB 15.1*  HCT 43.7  MCV 91.2  PLT 264   Cardiac Enzymes: No results found for this basename: CKTOTAL, CKMB, CKMBINDEX, TROPONINI,  in the last 168 hours BNP: BNP (last 3 results) No results found for this basename: PROBNP,  in the last 8760 hours CBG: No results found for this basename: GLUCAP,  in the last 168 hours  Active Problems:   Cholecystitis with cholelithiasis   Time coordinating discharge: <30 mins  Signed:  Chemika Nightengale, ANP-BC

## 2014-04-14 NOTE — Discharge Summary (Signed)
I have seen and examined the patient and agree with the assessment and plans.  Jakai Onofre A. Xavier Munger  MD, FACS  

## 2014-04-17 ENCOUNTER — Encounter (HOSPITAL_COMMUNITY): Payer: Self-pay | Admitting: Surgery

## 2014-05-02 ENCOUNTER — Encounter (INDEPENDENT_AMBULATORY_CARE_PROVIDER_SITE_OTHER): Payer: Self-pay | Admitting: General Surgery

## 2014-05-08 ENCOUNTER — Ambulatory Visit (INDEPENDENT_AMBULATORY_CARE_PROVIDER_SITE_OTHER): Payer: BC Managed Care – PPO | Admitting: General Surgery

## 2014-05-08 ENCOUNTER — Encounter (INDEPENDENT_AMBULATORY_CARE_PROVIDER_SITE_OTHER): Payer: Self-pay

## 2014-05-08 VITALS — BP 126/86 | HR 72 | Temp 98.6°F | Resp 14 | Ht 65.0 in | Wt 212.4 lb

## 2014-05-08 DIAGNOSIS — K810 Acute cholecystitis: Secondary | ICD-10-CM

## 2014-05-08 NOTE — Progress Notes (Signed)
Kerri Rodriguez 1971-08-04 [id number] 05/08/2014   Kerri Rodriguez is a 43 y.o. female who had a laparoscopic cholecystectomy with intraoperative cholangiogram by Dr. Coralie Keens.  The pathology report confirmed:  Gallbladder - CHRONIC CHOLECYSTITIS AND CHOLELITHIASIS.Marland Kitchen  The patient reports that they are feeling well with normal bowel movements and good appetite.  The pre-operative symptoms of abdominal pain, nausea, and vomiting have resolved.    Physical examination - BP 126/86  Pulse 72  Temp(Src) 98.6 F (37 C) (Oral)  Resp 14  Ht 5\' 5"  (1.651 m)  Wt 96.344 kg (212 lb 6.4 oz)  BMI 35.35 kg/m2  LMP 02/23/2014  Incisions appear well-healed with no sign of infection or bleeding.   Abdomen - soft, non-tender  Impression:  s/p laparoscopic cholecystectomy  Plan:  She may resume a regular diet and full activity.  She may follow-up on a PRN basis.

## 2014-05-08 NOTE — Patient Instructions (Signed)
Call and come back if we can help.

## 2014-09-12 ENCOUNTER — Other Ambulatory Visit: Payer: Self-pay | Admitting: Obstetrics and Gynecology

## 2014-09-13 LAB — CYTOLOGY - PAP

## 2015-07-04 IMAGING — US US ABDOMEN LIMITED
1 series · 14 of 25 positions shown · non-contrast
Comparison: None.

CLINICAL DATA: Abdominal pain

EXAM:
US ABDOMEN LIMITED - RIGHT UPPER QUADRANT

[Series 1: us abdomen limited · 0.26mm/px · 14 of 26 slices shown]
[im 1/26]
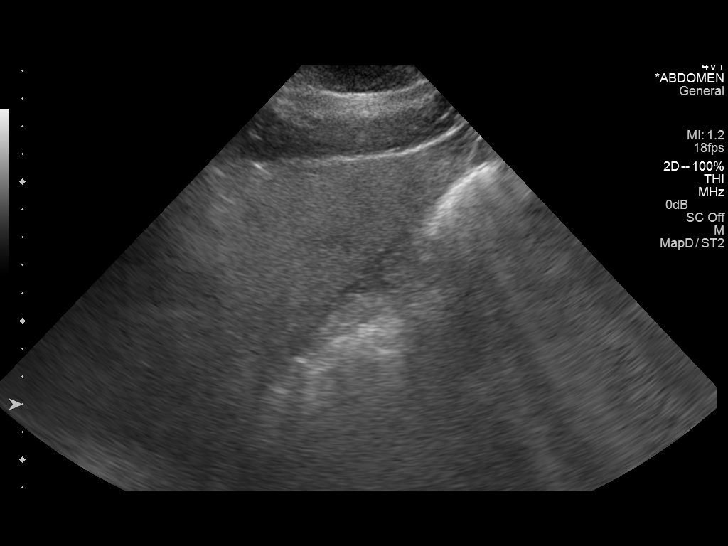
[im 3/26]
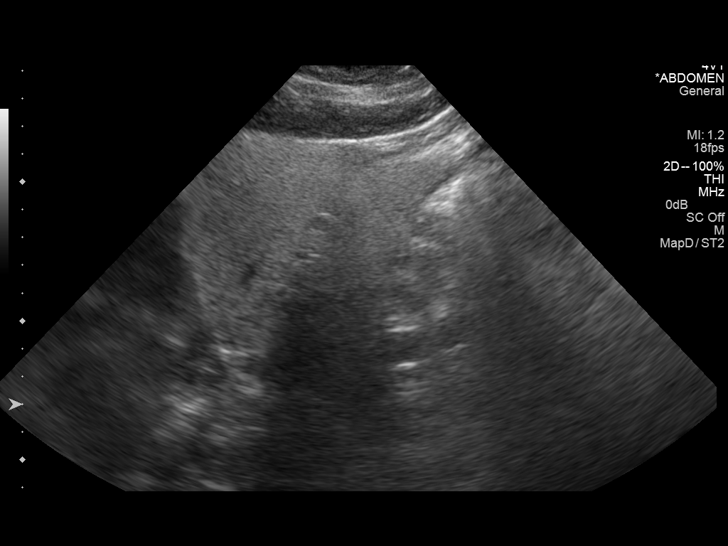
[im 5/26]
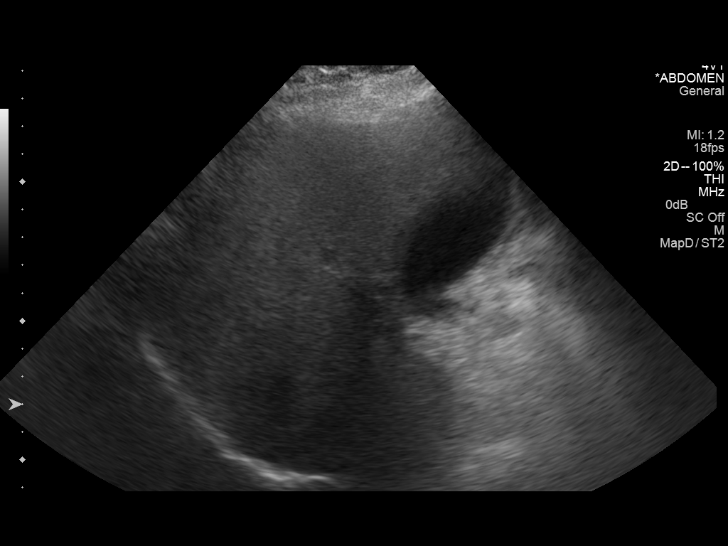
[im 7/26]
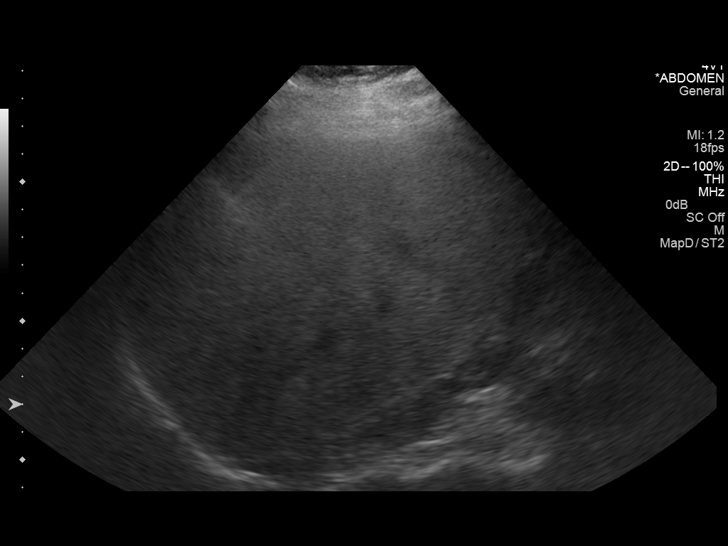
[im 9/26]
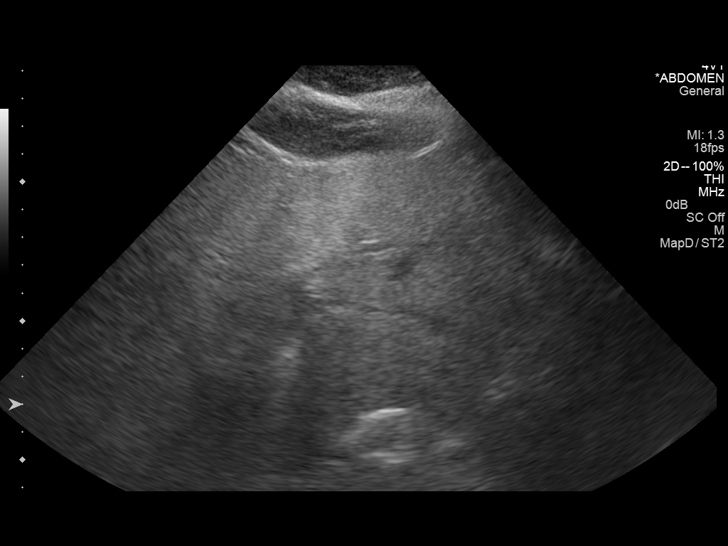
[im 10/26]
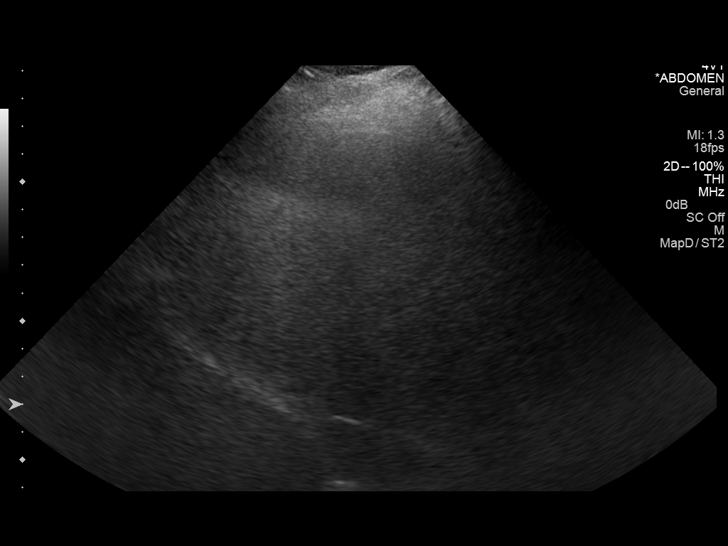
[im 12/26]
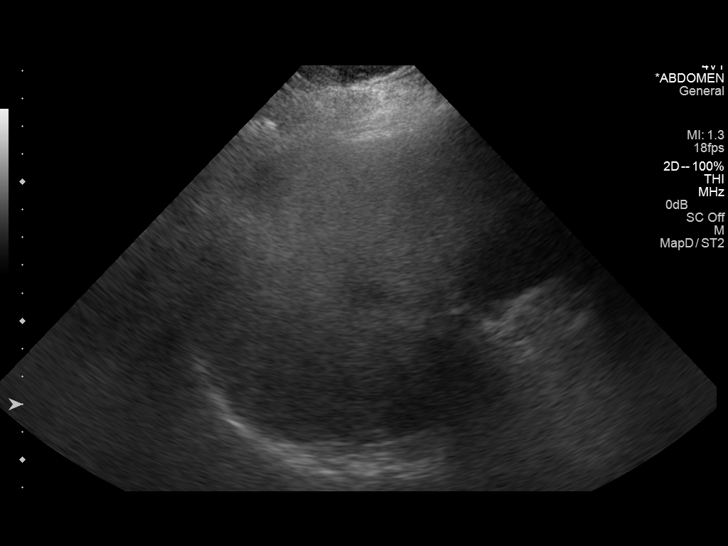
[im 14/26]
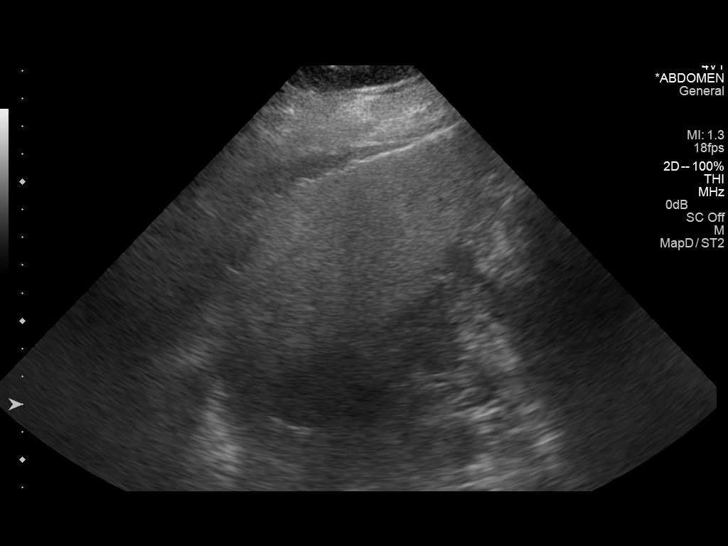
[im 16/26]
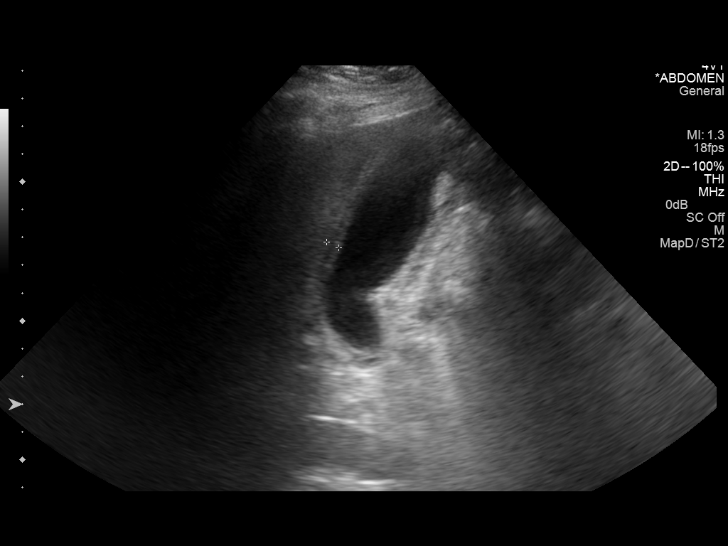
[im 17/26]
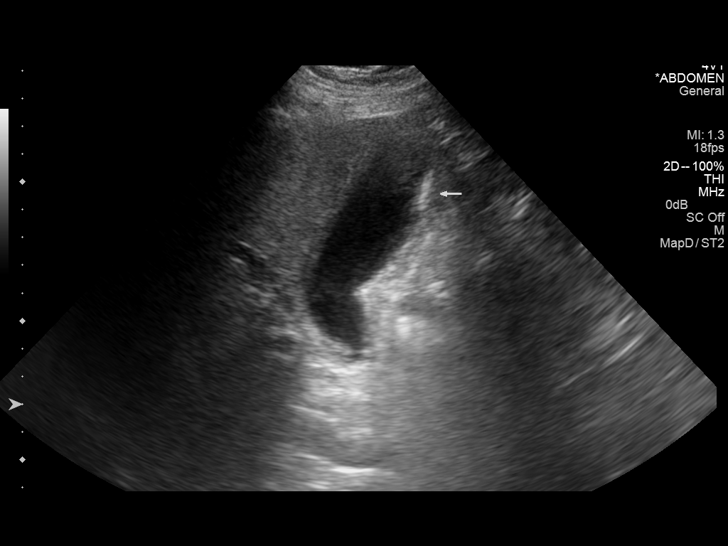
[im 19/26]
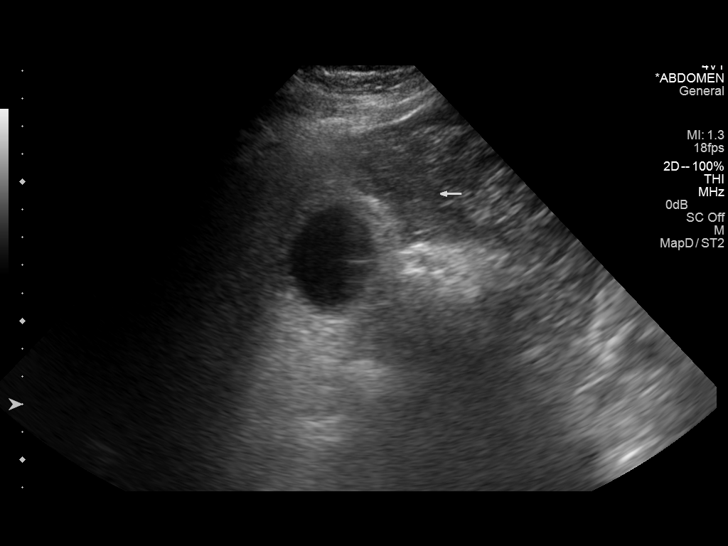
[im 21/26]
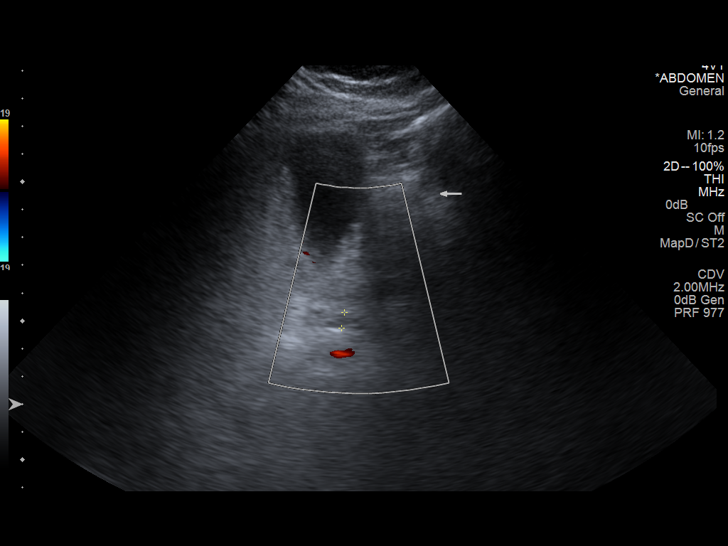
[im 23/26]
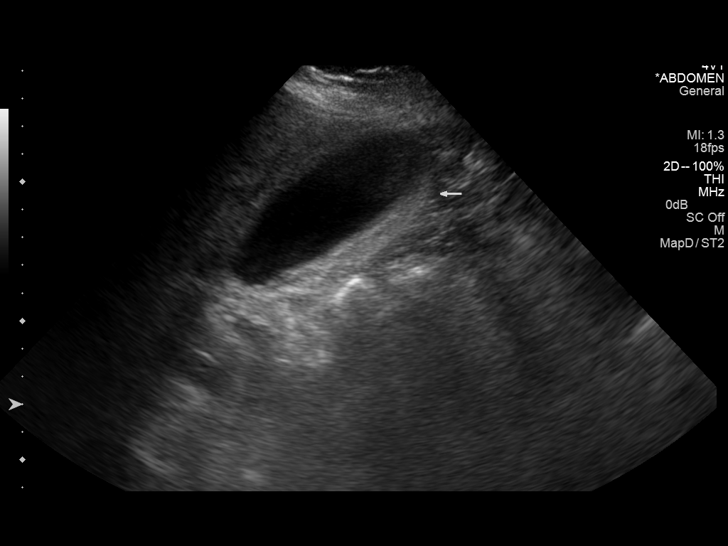
[im 26/26]
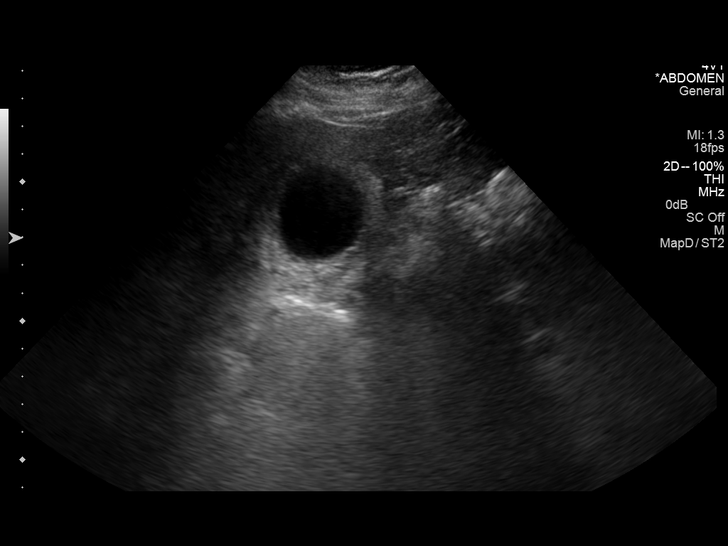

[14 of 25 positions shown; findings below may reference images not displayed]

FINDINGS: Gallbladder:

Small stones in the dependent portion of the gallbladder with mild
gallbladder wall thickening. Murphy's sign is positive. Probable
sludge in the gallbladder. Findings are nonspecific but consistent
with acute cholecystitis in the appropriate clinical setting.

Common bile duct:

Diameter: 5.7 mm, normal

Liver:

Diffusely increased echotexture throughout the liver suggesting
fatty infiltration. No focal lesions identified.
IMPRESSION: Cholelithiasis with gallbladder wall thickening and positive
Murphy's sign. Changes suggest acute cholecystitis in the
appropriate clinical setting. Diffuse fatty infiltration of the
liver.

## 2016-04-01 ENCOUNTER — Other Ambulatory Visit: Payer: Self-pay | Admitting: Obstetrics and Gynecology

## 2016-04-02 LAB — CYTOLOGY - PAP

## 2017-04-09 ENCOUNTER — Other Ambulatory Visit: Payer: Self-pay | Admitting: Obstetrics and Gynecology

## 2017-04-12 LAB — CYTOLOGY - PAP

## 2018-06-01 ENCOUNTER — Encounter: Payer: Self-pay | Admitting: Gastroenterology

## 2018-07-28 ENCOUNTER — Ambulatory Visit (INDEPENDENT_AMBULATORY_CARE_PROVIDER_SITE_OTHER): Payer: Managed Care, Other (non HMO) | Admitting: Gastroenterology

## 2018-07-28 ENCOUNTER — Encounter: Payer: Self-pay | Admitting: Gastroenterology

## 2018-07-28 VITALS — BP 104/68 | HR 80 | Ht 65.0 in | Wt 236.4 lb

## 2018-07-28 DIAGNOSIS — K219 Gastro-esophageal reflux disease without esophagitis: Secondary | ICD-10-CM | POA: Diagnosis not present

## 2018-07-28 DIAGNOSIS — K625 Hemorrhage of anus and rectum: Secondary | ICD-10-CM | POA: Diagnosis not present

## 2018-07-28 DIAGNOSIS — K529 Noninfective gastroenteritis and colitis, unspecified: Secondary | ICD-10-CM

## 2018-07-28 DIAGNOSIS — R1011 Right upper quadrant pain: Secondary | ICD-10-CM

## 2018-07-28 MED ORDER — OMEPRAZOLE 20 MG PO CPDR: 20.0000 mg | DELAYED_RELEASE_CAPSULE | Freq: Every day | ORAL | 3 refills | Status: DC

## 2018-07-28 MED ORDER — SUPREP BOWEL PREP KIT 17.5-3.13-1.6 GM/177ML PO SOLN: ORAL | 0 refills | Status: DC

## 2018-07-28 MED ORDER — COLESTIPOL HCL 1 G PO TABS: 1.0000 g | ORAL_TABLET | Freq: Two times a day (BID) | ORAL | 1 refills | Status: DC

## 2018-07-28 NOTE — Patient Instructions (Addendum)
If you are age 49 or older, your body mass index should be between 23-30. Your Body mass index is 39.33 kg/m. If this is out of the aforementioned range listed, please consider follow up with your Primary Care Provider.  If you are age 91 or younger, your body mass index should be between 19-25. Your Body mass index is 39.33 kg/m. If this is out of the aformentioned range listed, please consider follow up with your Primary Care Provider.   You have been scheduled for a colonoscopy. Please follow written instructions given to you at your visit today.  Please pick up your prep supplies at the pharmacy within the next 1-3 days. If you use inhalers (even only as needed), please bring them with you on the day of your procedure. Your physician has requested that you go to www.startemmi.com and enter the access code given to you at your visit today. This web site gives a general overview about your procedure. However, you should still follow specific instructions given to you by our office regarding your preparation for the procedure.  We will request your lab results from [facility].  We have sent the following medications to your pharmacy for you to pick up at your convenience: Omeprazole 20mg : Take once a day  Colestid 1g: Take twice a day  Use your Capsaican cream 2 to 3 times a day for RUQ pain.  Thank you for entrusting me with your care and for choosing [facility], Dr. [place] Cellar

## 2018-07-28 NOTE — Progress Notes (Signed)
HPI :  47 y/o female with a history of cholecystectomy, arthritis, depression, referred here by Shanon Rosser, PA-C  for a new patient visit for RUQ pain and other active GI issues.  She reports she has been experiencing pain in the right upper quadrant for the past several months. She states that she feels it daily, multiple times daily, [place] last all day. The site is tender to touch, often endorses a burning/itching sensation. This can be associated with positional changes such as changing posture sitting upright. She can often feel this at nighttime, but it does not wake her from sleep. Lying on her back or her left side makes things worse. She does not think it is reliably related to her eating. She does have some heartburn that has been bothering her, she thinks became worse when she gained weight while she started her plaque oil. She has been taking Zantac at night as needed which seems to help the heartburn but does not help her pain. She is status post cholecystectomy in 2015. She did have a fall while walking her dog in January, she is not sure if it related to her symptoms.  She does endorse some chronic loose stools since having her gallbladder out. She does have some urgency with her stools as well as some cramping after she eats. She has had some intermittent scant blood in her stools for the past 1-2 months intermittently. She suspects is related to hemorrhoids. She has roughly 4-5 hours per day. Pain is not relieved with a bowel movement. She's never had a prior colonoscopy. She's never had a prior upper endoscopy. She denies any family history of colon cancer or stomach cancer.  Labs obtained from her recent visit at urgent care as below:  Labs 05/31/2018 - WBC 8.2, Hgb 15.0, HCT 45.3, plt 308, MCV 88 - ALT 31, AST 19, AP 96, T bil 0.4, BUN 9, Cr 1.05 - lipase 45, amylase 55 - Hgb 5.4     Past Medical History:  Diagnosis Date  . Anxiety   . Arthritis   . Asthma   . Chronic  headache   . Depression   . Irritable bowel syndrome (IBS)   . Medical history non-contributory   . Obesity      Past Surgical History:  Procedure Laterality Date  . CHOLECYSTECTOMY N/A 04/12/2014   Procedure: LAPAROSCOPIC CHOLECYSTECTOMY;  Surgeon: Harl Bowie, MD;  Location: [facility];  Service: General;  Laterality: N/A;  . FOOT SURGERY Left 2006   fascia tendon clipped  . PLANTAR FASCIA SURGERY     Family History  Problem Relation Age of Onset  . Cancer Sister        liver  . Cancer Maternal Grandmother        brain  . Liver cancer Brother        was diagonied at a month old  . Colon cancer Neg Hx   . Esophageal cancer Neg Hx    Social History   Tobacco Use  . Smoking status: Current Every Day Smoker    Packs/day: 0.50    Types: Cigarettes  . Smokeless tobacco: Never Used  Substance Use Topics  . Alcohol use: Yes    Alcohol/week: 4.0 standard drinks    Types: 4 Shots of liquor per week  . Drug use: No   Current Outpatient Medications  Medication Sig Dispense Refill  . buPROPion (WELLBUTRIN XL) 150 MG 24 hr tablet Take 150 mg by mouth daily.    Marland Kitchen  furosemide (LASIX) 20 MG tablet Take 20 mg by mouth as needed.    . hydroxychloroquine (PLAQUENIL) 200 MG tablet Take 200 mg by mouth daily.     No current facility-administered medications for this visit.    No Known Allergies   Review of Systems: All systems reviewed and negative except where noted in HPI.    Labs per HPI  Physical Exam: BP 104/68   Pulse 80   Ht 5\' 5"  (1.651 m)   Wt 236 lb 6 oz (107.2 kg)   BMI 39.33 kg/m  Constitutional: Pleasant,well-developed, female in no acute distress. HEENT: Normocephalic and atraumatic. Conjunctivae are normal. No scleral icterus. Neck supple.  Cardiovascular: Normal rate, regular rhythm.  Pulmonary/chest: Effort normal and breath sounds normal. No wheezing, rales or rhonchi. Abdominal: Soft, nondistended, positive Carnett RUQ with reproduction of  symptoms.. There are no masses palpable. No hepatomegaly. Extremities: no edema Lymphadenopathy: No cervical adenopathy noted. Neurological: Alert and oriented to person place and time. Skin: Skin is warm and dry. No rashes noted. Psychiatric: Normal mood and affect. Behavior is normal.   ASSESSMENT AND PLAN: 47 year old female here for new patient assessment of the following issues:  Right upper quadrant pain - she is status post cholecystectomy, labs as above look good. Her pain is positional, reproduced with palpation and with positive Carnett sign. Based on her examination and history I suspect she more than likely has abdominal wall pain / neuropathic pain. I discussed what this was with her. Unclear if her prior fall is related in any way, is also possible recent weight gain is contributing as well. I think a trial of topical capsaicin cream applied to the area a few times a day is reasonable to start. If this does not help could consider a trial of gabapentin. If her pain persists despite therapy I would also consider a CT scan to ensure no other pathology. We discussed this for a bit, she wishes to hold off on imaging right now, she how she does with conservative therapy and will let me know.   GERD - having some heartburn which has bothered her, she thinks in the setting of weight gain. We'll try her on omeprazole 20 mg once daily to see if this helps. She will try to work on weight loss as well  Chronic diarrhea / rectal bleeding - I suspect she is having loose stools following her gallbladder surgery, and will recommend a trial of colestid 1gm BID to see if that helps. While I suspect she is more than likely having rectal bleeding from hemorrhoids as she describes her symptoms, given her age recommending a colonoscopy be done to further evaluate this issue. I discussed risks and benefits of colonoscopy with her and she wanted to proceed. Further recommendations pending the results, will  screen for microscopic colitis if symptoms are not improved with Colestid.  Damon Cellar, MD [facility]  CC: Shanon Rosser, [place]

## 2018-08-10 ENCOUNTER — Encounter: Payer: Self-pay | Admitting: Gastroenterology

## 2018-08-19 ENCOUNTER — Other Ambulatory Visit: Payer: Self-pay | Admitting: Gastroenterology

## 2018-08-24 ENCOUNTER — Encounter: Payer: Self-pay | Admitting: Gastroenterology

## 2018-08-24 ENCOUNTER — Ambulatory Visit (AMBULATORY_SURGERY_CENTER): Payer: Managed Care, Other (non HMO) | Admitting: Gastroenterology

## 2018-08-24 VITALS — BP 105/82 | HR 75 | Temp 98.9°F | Resp 17 | Ht 65.0 in | Wt 236.0 lb

## 2018-08-24 DIAGNOSIS — D123 Benign neoplasm of transverse colon: Secondary | ICD-10-CM

## 2018-08-24 DIAGNOSIS — K529 Noninfective gastroenteritis and colitis, unspecified: Secondary | ICD-10-CM | POA: Diagnosis present

## 2018-08-24 DIAGNOSIS — D128 Benign neoplasm of rectum: Secondary | ICD-10-CM | POA: Diagnosis not present

## 2018-08-24 DIAGNOSIS — D120 Benign neoplasm of cecum: Secondary | ICD-10-CM

## 2018-08-24 DIAGNOSIS — D127 Benign neoplasm of rectosigmoid junction: Secondary | ICD-10-CM | POA: Diagnosis not present

## 2018-08-24 DIAGNOSIS — D122 Benign neoplasm of ascending colon: Secondary | ICD-10-CM

## 2018-08-24 DIAGNOSIS — K625 Hemorrhage of anus and rectum: Secondary | ICD-10-CM | POA: Diagnosis not present

## 2018-08-24 DIAGNOSIS — D125 Benign neoplasm of sigmoid colon: Secondary | ICD-10-CM | POA: Diagnosis not present

## 2018-08-24 DIAGNOSIS — K635 Polyp of colon: Secondary | ICD-10-CM

## 2018-08-24 MED ORDER — SODIUM CHLORIDE 0.9 % IV SOLN: 500.0000 mL | Freq: Once | INTRAVENOUS | Status: DC

## 2018-08-24 NOTE — Progress Notes (Signed)
Report given to PACU, vss 

## 2018-08-24 NOTE — Op Note (Signed)
[facility] Patient Name: Kerri Rodriguez Procedure Date: 08/24/2018 1:56 PM MRN: [id number] Endoscopist: Remo Lipps P. Havery Moros , MD Age: 47 Referring MD:  Date of Birth: 1971-10-03 Gender: Female Account #: [id number] Procedure:                Colonoscopy Indications:              Chronic diarrhea, Hematochezia Medicines:                Monitored Anesthesia Care Procedure:                Pre-Anesthesia Assessment:                           - Prior to the procedure, a History and Physical                            was performed, and patient medications and                            allergies were reviewed. The patient's tolerance of                            previous anesthesia was also reviewed. The risks                            and benefits of the procedure and the sedation                            options and risks were discussed with the patient.                            All questions were answered, and informed consent                            was obtained. Prior Anticoagulants: The patient has                            taken no previous anticoagulant or antiplatelet                            agents. ASA Grade Assessment: I - A normal, healthy                            patient. After reviewing the risks and benefits,                            the patient was deemed in satisfactory condition to                            undergo the procedure.                           After obtaining informed consent, the colonoscope  was passed under direct vision. Throughout the                            procedure, the patient's blood pressure, pulse, and                            oxygen saturations were monitored continuously. The                            Colonoscope was introduced through the anus and                            advanced to the the terminal ileum, with                            identification of the appendiceal orifice  and IC                            valve. The colonoscopy was performed without                            difficulty. The patient tolerated the procedure                            well. The quality of the bowel preparation was                            adequate. The terminal ileum, ileocecal valve,                            appendiceal orifice, and rectum were photographed. Scope In: 2:07:03 PM Scope Out: 2:28:00 PM Scope Withdrawal Time: 0 hours 18 minutes 42 seconds  Total Procedure Duration: 0 hours 20 minutes 57 seconds  Findings:                 The perianal and digital rectal examinations were                            normal.                           The terminal ileum appeared normal.                           Three sessile polyps were found in the cecum. The                            polyps were 5 to 10 mm in size. These polyps were                            removed with a cold snare. Resection and retrieval                            were complete.  A diminutive polyp was found in the ascending                            colon. The polyp was sessile. The polyp was removed                            with a cold biopsy forceps. Resection and retrieval                            were complete.                           Three sessile polyps were found in the transverse                            colon. The polyps were 4 to 5 mm in size. These                            polyps were removed with a cold snare. Resection                            and retrieval were complete.                           A 5 mm polyp was found in the sigmoid colon. The                            polyp was sessile. The polyp was removed with a                            cold snare. Resection and retrieval were complete.                           A 3 mm polyp was found in the recto-sigmoid colon.                            The polyp was sessile. The polyp was removed with a                             cold snare. Resection and retrieval were complete.                           A 4 mm polyp was found in the rectum. The polyp was                            sessile. The polyp was removed with a cold snare.                            Resection and retrieval were complete.                           Internal hemorrhoids were found during retroflexion.  The exam was otherwise without abnormality.                           Biopsies for histology were taken with a cold                            forceps from the right colon and left colon for                            evaluation of microscopic colitis. Complications:            No immediate complications. Estimated blood loss:                            Minimal. Estimated Blood Loss:     Estimated blood loss was minimal. Impression:               - The examined portion of the ileum was normal.                           - Three 5 to 10 mm polyps in the cecum, removed                            with a cold snare. Resected and retrieved.                           - One diminutive polyp in the ascending colon,                            removed with a cold biopsy forceps. Resected and                            retrieved.                           - Three 4 to 5 mm polyps in the transverse colon,                            removed with a cold snare. Resected and retrieved.                           - One 5 mm polyp in the sigmoid colon, removed with                            a cold snare. Resected and retrieved.                           - One 3 mm polyp at the recto-sigmoid colon,                            removed with a cold snare. Resected and retrieved.                           - One 4 mm polyp  in the rectum, removed with a cold                            snare. Resected and retrieved.                           - Internal hemorrhoids.                           - The examination was  otherwise normal.                           - Biopsies were taken with a cold forceps from the                            right colon and left colon for evaluation of                            microscopic colitis.                           Overall, no overt cause for diarrhea seen on this                            exam, which may be due to post-cholecystectomy                            state, will await biopsies. Hemorrhoids are likely                            causing bleeding symptoms. Recommendation:           - Patient has a contact number available for                            emergencies. The signs and symptoms of potential                            delayed complications were discussed with the                            patient. Return to normal activities tomorrow.                            Written discharge instructions were provided to the                            patient.                           - Resume previous diet.                           - Continue present medications.                           -  Await pathology results. Remo Lipps P. Kimberlyann Hollar, MD 08/24/2018 2:34:54 PM This report has been signed electronically.

## 2018-08-24 NOTE — Patient Instructions (Signed)
YOU HAD AN ENDOSCOPIC PROCEDURE TODAY AT [facility]:   Refer to the procedure report that was given to you for any specific questions about what was found during the examination.  If the procedure report does not answer your questions, please call your gastroenterologist to clarify.  If you requested that your care partner not be given the details of your procedure findings, then the procedure report has been included in a sealed envelope for you to review at your convenience later.  YOU SHOULD EXPECT: Some feelings of bloating in the abdomen. Passage of more gas than usual.  Walking can help get rid of the air that was put into your GI tract during the procedure and reduce the bloating. If you had a lower endoscopy (such as a colonoscopy or flexible sigmoidoscopy) you may notice spotting of blood in your stool or on the toilet paper. If you underwent a bowel prep for your procedure, you may not have a normal bowel movement for a few days.  Please Note:  You might notice some irritation and congestion in your nose or some drainage.  This is from the oxygen used during your procedure.  There is no need for concern and it should clear up in a day or so.  SYMPTOMS TO REPORT IMMEDIATELY:   Following lower endoscopy (colonoscopy or flexible sigmoidoscopy):  Excessive amounts of blood in the stool  Significant tenderness or worsening of abdominal pains  Swelling of the abdomen that is new, acute  Fever of 100F or higher   For urgent or emergent issues, a gastroenterologist can be reached at any hour by calling [phone].   DIET:  We do recommend a small meal at first, but then you may proceed to your regular diet.  Drink plenty of fluids but you should avoid alcoholic beverages for 24 hours.  ACTIVITY:  You should plan to take it easy for the rest of today and you should NOT DRIVE or use heavy machinery until tomorrow (because of the sedation medicines used during the test).     FOLLOW UP: Our staff will call the number listed on your records the next business day following your procedure to check on you and address any questions or concerns that you may have regarding the information given to you following your procedure. If we do not reach you, we will leave a message.  However, if you are feeling well and you are not experiencing any problems, there is no need to return our call.  We will assume that you have returned to your regular daily activities without incident.  If any biopsies were taken you will be contacted by phone or by letter within the next 1-3 weeks.  Please call us at [phone] if you have not heard about the biopsies in 3 weeks.    SIGNATURES/CONFIDENTIALITY: You and/or your care partner have signed paperwork which will be entered into your electronic medical record.  These signatures attest to the fact that that the information above on your After Visit Summary has been reviewed and is understood.  Full responsibility of the confidentiality of this discharge information lies with you and/or your care-partner.  Polyps and hemorrhoid information given.

## 2018-08-24 NOTE — Progress Notes (Signed)
Called to room to assist during endoscopic procedure.  Patient ID and intended procedure confirmed with present staff. Received instructions for my participation in the procedure from the performing physician.  

## 2018-08-25 ENCOUNTER — Telehealth: Payer: Self-pay

## 2018-08-25 NOTE — Telephone Encounter (Signed)
  Follow up Call-  Call back number 08/24/2018  Post procedure Call Back phone  # [phone] cell   Permission to leave phone message Yes  Some recent data might be hidden     Patient questions:  Do you have a fever, pain , or abdominal swelling? No. Pain Score  0 *  Have you tolerated food without any problems? Yes.    Have you been able to return to your normal activities? Yes.    Do you have any questions about your discharge instructions: Diet   No. Medications  No. Follow up visit  No.  Do you have questions or concerns about your Care? No.  Actions: * If pain score is 4 or above: No action needed, pain <4.

## 2018-08-25 NOTE — Telephone Encounter (Signed)
Left message for follow-up. 

## 2018-09-02 ENCOUNTER — Other Ambulatory Visit: Payer: Self-pay

## 2018-09-02 MED ORDER — OMEPRAZOLE 20 MG PO CPDR: 20.0000 mg | DELAYED_RELEASE_CAPSULE | Freq: Every day | ORAL | 3 refills | Status: DC

## 2018-09-29 ENCOUNTER — Telehealth: Payer: Self-pay | Admitting: Gastroenterology

## 2018-09-29 NOTE — Telephone Encounter (Signed)
Spoke to patient, read her the letter from [place]. She has no further questions. She will try the colestid again this weekend. I have printed off the letter and mailed it to her.

## 2018-09-29 NOTE — Telephone Encounter (Signed)
Patient requesting to speak with nurse to fins out results from procedure on 9.4.19.

## 2019-12-22 ENCOUNTER — Other Ambulatory Visit: Payer: Self-pay | Admitting: Gastroenterology

## 2020-03-14 ENCOUNTER — Other Ambulatory Visit: Payer: Self-pay | Admitting: Gastroenterology

## 2021-07-21 DIAGNOSIS — U071 COVID-19: Secondary | ICD-10-CM

## 2021-07-21 HISTORY — DX: COVID-19: U07.1

## 2021-07-30 ENCOUNTER — Encounter (HOSPITAL_COMMUNITY): Payer: Self-pay

## 2021-07-30 ENCOUNTER — Other Ambulatory Visit: Payer: Self-pay

## 2021-07-30 ENCOUNTER — Ambulatory Visit (HOSPITAL_COMMUNITY)
Admission: EM | Admit: 2021-07-30 | Discharge: 2021-07-30 | Disposition: A | Discharge status: Home / Self Care | Payer: BC Managed Care – PPO | Attending: Student | Admitting: Student

## 2021-07-30 DIAGNOSIS — J4521 Mild intermittent asthma with (acute) exacerbation: Secondary | ICD-10-CM | POA: Diagnosis not present

## 2021-07-30 DIAGNOSIS — U071 COVID-19: Secondary | ICD-10-CM

## 2021-07-30 DIAGNOSIS — H8110 Benign paroxysmal vertigo, unspecified ear: Secondary | ICD-10-CM | POA: Diagnosis not present

## 2021-07-30 DIAGNOSIS — R110 Nausea: Secondary | ICD-10-CM

## 2021-07-30 DIAGNOSIS — M069 Rheumatoid arthritis, unspecified: Secondary | ICD-10-CM

## 2021-07-30 MED ORDER — ALBUTEROL SULFATE HFA 108 (90 BASE) MCG/ACT IN AERS: 1.0000 | INHALATION_SPRAY | Freq: Four times a day (QID) | RESPIRATORY_TRACT | 0 refills | Status: AC | PRN

## 2021-07-30 MED ORDER — ONDANSETRON 8 MG PO TBDP: 8.0000 mg | ORAL_TABLET | Freq: Three times a day (TID) | ORAL | 0 refills | Status: DC | PRN

## 2021-07-30 NOTE — ED Triage Notes (Signed)
Pt in with c/o nausea, dizziness, diarrhea, and sob that has been going on x 4 days  Pt states she is covid positive, but out of quarantine  Pt states nausea is worse when she eats,  and dizziness is worse when she stands and turns her head to the side quickly or rolls over in bed  Pt has been taking tylenol for fever reduction

## 2021-07-30 NOTE — Discharge Instructions (Addendum)
-  Finish prednisone taper. Take this with food.  -albuterol inhaler as needed for cough, shortness of breath. -Take the Zofran (ondansetron) up to 3 times daily for nausea and vomiting. Dissolve one pill under your tongue or between your teeth and your cheek. -Come back and see [place] or PCP if symptoms worsen/persist

## 2021-07-30 NOTE — ED Provider Notes (Signed)
[facility]    CSN: [id number] Arrival date & time: 07/30/21  1714      History   Chief Complaint Chief Complaint  Patient presents with   Covid Positive   Dizziness   Diarrhea   Nausea    HPI Kerri Rodriguez is a 50 y.o. female presenting with dizziness, diarrhea, nausea, shortness of breath for 7 days following COVID-19 diagnosis.  Medical history childhood asthma, arthritis, anxiety, allergies, GERD, IBS, obesity, Sjogren's disease, RA.  Shortness of breath with exertion, states this feels like her asthma exacerbations in the past.  Dizziness with moving her head and going from sitting to standing, states this feels like the room is spinning.  Denies ear pain, tinnitus, hearing changes.  Was diagnosed with covid-19 7 days ago. Nausea with some vomiting but none today. Denies diarrhea. Was actually prescribed prednisone taper by another urgent care provider but has not been taking this due to nausea. Not vaccinated for covid-19 and did not take antiviral.  HPI  Past Medical History:  Diagnosis Date   Allergy    Anxiety    Arthritis    Asthma    "when I was younger and when I smoked"   Chronic headache    Depression    GERD (gastroesophageal reflux disease)    Irritable bowel syndrome (IBS)    Medical history non-contributory    Obesity    Sjogren's disease ([place])     Patient Active Problem List   Diagnosis Date Noted   Cholecystitis with cholelithiasis 04/12/2014    Past Surgical History:  Procedure Laterality Date   CHOLECYSTECTOMY N/A 04/12/2014   Procedure: LAPAROSCOPIC CHOLECYSTECTOMY;  Surgeon: Harl Bowie, MD;  Location: [facility];  Service: General;  Laterality: N/A;   FOOT SURGERY Left 2006   fascia tendon clipped   PLANTAR FASCIA SURGERY     WISDOM TOOTH EXTRACTION      OB History   No obstetric history on file.      Home Medications    Prior to Admission medications   Medication Sig Start Date End Date Taking? Authorizing  Provider  albuterol (VENTOLIN HFA) 108 (90 Base) MCG/ACT inhaler Inhale 1-2 puffs into the lungs every 6 (six) hours as needed for wheezing or shortness of breath. 07/30/21  Yes Hazel Sams, PA-C  ondansetron (ZOFRAN ODT) 8 MG disintegrating tablet Take 1 tablet (8 mg total) by mouth every 8 (eight) hours as needed for nausea or vomiting. 07/30/21  Yes Hazel Sams, PA-C  buPROPion (WELLBUTRIN XL) 150 MG 24 hr tablet Take 150 mg by mouth daily.    [provider]  colestipol (COLESTID) 1 g tablet Take 1 tablet (1 g total) by mouth 2 (two) times daily. 07/28/18   Armbruster, Carlota Raspberry, MD  fluticasone (FLONASE) 50 MCG/ACT nasal spray SPRAY 1 SPRAY INTO EACH NOSTRIL AT BEDTIME MAY REPEAT IN 12 HOURS IF NEEDED 05/23/18   [provider]  furosemide (LASIX) 20 MG tablet Take 20 mg by mouth as needed.    [provider]  hydroxychloroquine (PLAQUENIL) 200 MG tablet Take 200 mg by mouth daily.    [provider]  methocarbamol (ROBAXIN) 500 MG tablet methocarbamol 500 mg tablet    [provider]  omeprazole (PRILOSEC) 20 MG capsule Take 1 capsule (20 mg total) by mouth daily. Please schedule a yearly Office Visit for further refills. 12/23/19   Armbruster, Carlota Raspberry, MD  SUMAtriptan (IMITREX) 100 MG tablet TAKE 1 AT ONSET OF MIGRAINE. MAY  REPEAT X 1 DOSE IN 2HR IF NEEDED 05/31/18   [provider]    Family History Family History  Problem Relation Age of Onset   Cancer Sister        liver   Liver cancer Sister        34 months old dx- born with liver ca.  alive age 72.   Cancer Maternal Grandmother        brain   Colon cancer Neg Hx    Esophageal cancer Neg Hx    Rectal cancer Neg Hx    Stomach cancer Neg Hx     Social History Social History   Tobacco Use   Smoking status: Former    Packs/day: 0.50    Types: Cigarettes    Quit date: 08/21/2016    Years since quitting: 4.9   Smokeless tobacco: Never  Vaping Use   Vaping Use: Former   Substance Use Topics   Alcohol use: Yes    Alcohol/week: 4.0 standard drinks    Types: 4 Shots of liquor per week   Drug use: No     Allergies   Baclofen   Review of Systems Review of Systems  Constitutional:  Negative for appetite change, chills and fever.  HENT:  Positive for congestion. Negative for ear pain, rhinorrhea, sinus pressure, sinus pain and sore throat.   Eyes:  Negative for redness and visual disturbance.  Respiratory:  Positive for cough and shortness of breath. Negative for chest tightness and wheezing.   Cardiovascular:  Negative for chest pain and palpitations.  Gastrointestinal:  Positive for nausea. Negative for abdominal pain, constipation, diarrhea and vomiting.  Genitourinary:  Negative for dysuria, frequency and urgency.  Musculoskeletal:  Negative for myalgias.  Neurological:  Positive for dizziness. Negative for weakness and headaches.  Psychiatric/Behavioral:  Negative for confusion.   All other systems reviewed and are negative.   Physical Exam Triage Vital Signs ED Triage Vitals  Enc Vitals Group     BP 07/30/21 1808 127/82     Pulse Rate 07/30/21 1808 82     Resp 07/30/21 1808 19     Temp 07/30/21 1808 98.6 F (37 C)     Temp Source 07/30/21 1808 Oral     SpO2 07/30/21 1808 98 %     Weight --      Height --      Head Circumference --      Peak Flow --      Pain Score 07/30/21 1810 0     Pain Loc --      Pain Edu? --      Excl. in [place]? --    No data found.  Updated Vital Signs BP 127/82 (BP Location: Left Arm)   Pulse 82   Temp 98.6 F (37 C) (Oral)   Resp 19   SpO2 98%   Visual Acuity Right Eye Distance:   Left Eye Distance:   Bilateral Distance:    Right Eye Near:   Left Eye Near:    Bilateral Near:     Physical Exam Vitals reviewed.  Constitutional:      General: She is not in acute distress.    Appearance: Normal appearance. She is not ill-appearing.  HENT:     Head: Normocephalic and atraumatic.     Right  Ear: Tympanic membrane, ear canal and external ear normal. No tenderness. No middle ear effusion. There is no impacted cerumen. Tympanic membrane is not perforated, erythematous, retracted or bulging.  Left Ear: Tympanic membrane, ear canal and external ear normal. No tenderness.  No middle ear effusion. There is no impacted cerumen. Tympanic membrane is not perforated, erythematous, retracted or bulging.     Nose: Nose normal. No congestion.     Mouth/Throat:     Mouth: Mucous membranes are moist.     Pharynx: Uvula midline. No oropharyngeal exudate or posterior oropharyngeal erythema.  Eyes:     Extraocular Movements: Extraocular movements intact.     Pupils: Pupils are equal, round, and reactive to light.  Cardiovascular:     Rate and Rhythm: Normal rate and regular rhythm.     Heart sounds: Normal heart sounds.  Pulmonary:     Effort: Pulmonary effort is normal.     Breath sounds: Decreased breath sounds present. No wheezing, rhonchi or rales.     Comments: Decreased breath sounds throughout. Abdominal:     Palpations: Abdomen is soft.     Tenderness: There is no abdominal tenderness. There is no guarding or rebound.  Skin:    Capillary Refill: Capillary refill takes less than 2 seconds.  Neurological:     General: No focal deficit present.     Mental Status: She is alert and oriented to person, place, and time.     Comments: CN 2-12 grossly intact, PERRLA, EOMI. Negative rhomberg, pronator drift.   Psychiatric:        Mood and Affect: Mood normal.        Behavior: Behavior normal.        Thought Content: Thought content normal.        Judgment: Judgment normal.     UC Treatments / Results  Labs (all labs ordered are listed, but only abnormal results are displayed) Labs Reviewed - No data to display  EKG   Radiology No results found.  Procedures Procedures (including critical care time)  Medications Ordered in UC Medications - No data to display  Initial  Impression / Assessment and Plan / UC Course  I have reviewed the triage vital signs and the nursing notes.  Pertinent labs & imaging results that were available during my care of the patient were reviewed by me and considered in my medical decision making (see chart for details).     This patient is a very pleasant 50 y.o. year old female presenting with multiple complaints following covid-19 diagnosis one week ago. Today afebrile, nontachy, oxygenating well on room air. Decreased breath sounds but no wheezes rhonchi or rales. She is not vaccinated for covid-19 and did not take antiviral. Medical history RA, mild intermittent asthma.  Current asthma exacerbation, so advised her to complete the prednisone taper that the other urgent care provider sent, and I also sent albuterol. Zofran ODT for nausea. BPPV- will treat with prednisone, as I suspect this is related to mid ear effusion. Benign neuro exam today. Strict ED return precautions- f/u with PCP if symptoms persist. ED return precautions discussed. Patient verbalizes understanding and agreement.   Level 4 for acute illness with systemic symptoms and prescription drug management.  Final Clinical Impressions(s) / UC Diagnoses   Final diagnoses:  Mild intermittent asthma with acute exacerbation  Rheumatoid arthritis, involving unspecified site, unspecified whether rheumatoid factor present (HCC)  Benign paroxysmal positional vertigo, unspecified laterality  Nausea without vomiting  COVID-19     Discharge Instructions      -Finish prednisone taper. Take this with food.  -albuterol inhaler as needed for cough, shortness of breath. -Take the Zofran (ondansetron) up  to 3 times daily for nausea and vomiting. Dissolve one pill under your tongue or between your teeth and your cheek. -Come back and see [place] or PCP if symptoms worsen/persist     ED Prescriptions     Medication Sig Dispense Auth. Provider   albuterol (VENTOLIN HFA) 108 (90  Base) MCG/ACT inhaler Inhale 1-2 puffs into the lungs every 6 (six) hours as needed for wheezing or shortness of breath. 1 each Hazel Sams, PA-C   ondansetron (ZOFRAN ODT) 8 MG disintegrating tablet Take 1 tablet (8 mg total) by mouth every 8 (eight) hours as needed for nausea or vomiting. 20 tablet Hazel Sams, PA-C      PDMP not reviewed this encounter.   Hazel Sams, PA-C 07/30/21 [id number]

## 2021-10-07 ENCOUNTER — Encounter: Payer: Self-pay | Admitting: Gastroenterology

## 2021-10-16 ENCOUNTER — Encounter: Payer: Self-pay | Admitting: Gastroenterology

## 2021-11-24 ENCOUNTER — Ambulatory Visit (INDEPENDENT_AMBULATORY_CARE_PROVIDER_SITE_OTHER): Payer: BC Managed Care – PPO | Admitting: Gastroenterology

## 2021-11-24 ENCOUNTER — Encounter: Payer: Self-pay | Admitting: Gastroenterology

## 2021-11-24 ENCOUNTER — Other Ambulatory Visit: Payer: BC Managed Care – PPO

## 2021-11-24 VITALS — BP 110/72 | HR 88 | Ht 65.0 in | Wt 227.0 lb

## 2021-11-24 DIAGNOSIS — K529 Noninfective gastroenteritis and colitis, unspecified: Secondary | ICD-10-CM

## 2021-11-24 DIAGNOSIS — K649 Unspecified hemorrhoids: Secondary | ICD-10-CM

## 2021-11-24 DIAGNOSIS — K219 Gastro-esophageal reflux disease without esophagitis: Secondary | ICD-10-CM

## 2021-11-24 DIAGNOSIS — R1011 Right upper quadrant pain: Secondary | ICD-10-CM | POA: Diagnosis not present

## 2021-11-24 DIAGNOSIS — Z86010 Personal history of colonic polyps: Secondary | ICD-10-CM

## 2021-11-24 MED ORDER — CALMOL-4 76-10 % RE SUPP: RECTAL | 0 refills | Status: DC

## 2021-11-24 MED ORDER — OMEPRAZOLE 40 MG PO CPDR: 40.0000 mg | DELAYED_RELEASE_CAPSULE | Freq: Two times a day (BID) | ORAL | 1 refills | Status: DC

## 2021-11-24 MED ORDER — SUTAB 1479-225-188 MG PO TABS: 1.0000 | ORAL_TABLET | Freq: Once | ORAL | 0 refills | Status: AC

## 2021-11-24 MED ORDER — LOPERAMIDE HCL 2 MG PO TABS: 2.0000 mg | ORAL_TABLET | ORAL | 0 refills | Status: DC | PRN

## 2021-11-24 NOTE — Progress Notes (Signed)
HPI :  50 year old female with a history of cholecystectomy, Sjogren's syndrome, chronic diarrhea, chronic right upper quadrant pain, GERD, here to reestablish care for some of these issues, referred by Everardo Beals, NP.  Patient was last seen here in September 2019.  Recall that at her last visit she had endorsed loose stools since having her gallbladder out.  She states this is been an ongoing issue for her for several years now.  She states at times she will not have any issues and has regular bowel habits however then can have some days with postprandial loose stools that are urgent and can bother her significantly.  She will say about 3 to 4 days out of a week she can have loose stools, upwards of 3-5 loose stools per day.  She has had more problems with hemorrhoids recently and has some superficial bleeding from those, she had COVID in August and states it made the hemorrhoids much worse.  I had tried her empirically on Colestid about 3 years ago and she does not think it helped too much.  She had a colonoscopy with me in September 2019, 10 polyps were removed, mix of sessile serrated and tubular adenomas, largest was 1 cm in size, had recommended a 3-year follow-up exam for that.  She had biopsies taken of her colon which were negative for microscopic colitis.  She otherwise endorses ongoing pain in her right upper side.  She states this is been ongoing for years since her cholecystectomy as well.  Previously the last time I saw her it was tender to the touch and a burning sensation present all the time and had some positional change.  I suspected this was musculoskeletal post surgery.  She states it is continued since have last seen her however she does not feel it daily, it can come and go.  She has "flareups" which can cause significant discomfort but then times where she does not feel as bad.  She states is not as much positional as it was before, she can often feel this after she eats  something.  She has had reflux has been bothering her recently, previously was on omeprazole 20 mg daily but ran out of it a few months ago.  Even despite this she states it was not controlling her reflux symptoms and she was increasing the dose to 20 mg twice daily, still did not resolve.  No dysphagia.  She is never had a prior EGD.  No NSAIDs.  She is followed by rheumatology for Sjogren's syndrome. She has had labs done by her rheumatologist but not available for [place] to review today.  Prior work-up: Colonoscopy 08/24/2018 -  The examined portion of the ileum was normal. - Three 5 to 10 mm polyps in the cecum, removed with a cold snare. Resected and retrieved. - One diminutive polyp in the ascending colon, removed with a cold biopsy forceps. Resected and retrieved. - Three 4 to 5 mm polyps in the transverse colon, removed with a cold snare. Resected and retrieved. - One 5 mm polyp in the sigmoid colon, removed with a cold snare. Resected and retrieved. - One 3 mm polyp at the recto-sigmoid colon, removed with a cold snare. Resected and retrieved. - One 4 mm polyp in the rectum, removed with a cold snare. Resected and retrieved. - Internal hemorrhoids. - The examination was otherwise normal. - Biopsies were taken with a cold forceps from the right colon and left colon for evaluation of microscopic colitis. Overall,  no overt cause for diarrhea seen on this exam, which may be due to postcholecystectomy state, will await biopsies. Hemorrhoids are likely causing bleeding symptoms.  1. Surgical [P], rectum, transverse, ascending, cecum, sigmoid, rectosigmoid, polyp (10) - TUBULAR ADENOMA(S) WITHOUT HIGH GRADE DYSPLASIA OR MALIGNANCY. - SESSILE SERRATED POLYP(S) WITHOUT CYTOLOGIC DYSPLASIA. 2. Surgical [P], random sites - COLONIC MUCOSA WITH NO SPECIFIC HISTOPATHOLOGIC CHANGES. - NEGATIVE FOR ACUTE INFLAMMATION, INCREASED INTRAEPITHELIAL LYMPHOCYTES OR THICKENED SUBEPITHELIAL COLLAGEN  TABLE.  Past Medical History:  Diagnosis Date   Allergy    Anxiety    Arthritis    Asthma    "when I was younger and when I smoked"   Chronic headache    COVID 07/2021   Depression    GERD (gastroesophageal reflux disease)    Irritable bowel syndrome (IBS)    Medical history non-contributory    Obesity    Sjogren's disease ([place])      Past Surgical History:  Procedure Laterality Date   CHOLECYSTECTOMY N/A 04/12/2014   Procedure: LAPAROSCOPIC CHOLECYSTECTOMY;  Surgeon: Harl Bowie, MD;  Location: [place];  Service: General;  Laterality: N/A;   FOOT SURGERY Left 2006   fascia tendon clipped   PLANTAR FASCIA SURGERY     WISDOM TOOTH EXTRACTION     Family History  Problem Relation Age of Onset   Colon cancer Father        Dx at age 63   Cancer Sister        liver   Liver cancer Sister        64 months old dx- born with liver ca.  alive age 48.   Cancer Maternal Grandmother        brain   Esophageal cancer Neg Hx    Rectal cancer Neg Hx    Stomach cancer Neg Hx    Social History   Tobacco Use   Smoking status: Former    Packs/day: 0.50    Types: Cigarettes    Quit date: 08/21/2016    Years since quitting: 5.2   Smokeless tobacco: Never  Vaping Use   Vaping Use: Former  Substance Use Topics   Alcohol use: Yes    Alcohol/week: 4.0 standard drinks    Types: 4 Shots of liquor per week    Comment: social   Drug use: No   Current Outpatient Medications  Medication Sig Dispense Refill   albuterol (VENTOLIN HFA) 108 (90 Base) MCG/ACT inhaler Inhale 1-2 puffs into the lungs every 6 (six) hours as needed for wheezing or shortness of breath. 1 each 0   buPROPion (WELLBUTRIN XL) 150 MG 24 hr tablet Take 150 mg by mouth daily.     fluticasone (FLONASE) 50 MCG/ACT nasal spray SPRAY 1 SPRAY INTO EACH NOSTRIL AT BEDTIME MAY REPEAT IN 12 HOURS IF NEEDED  1   hydroxychloroquine (PLAQUENIL) 200 MG tablet Take 200 mg by mouth daily.     methocarbamol (ROBAXIN) 500 MG tablet  methocarbamol 500 mg tablet     omeprazole (PRILOSEC) 20 MG capsule Take 1 capsule (20 mg total) by mouth daily. Please schedule a yearly Office Visit for further refills. 90 capsule 0   ondansetron (ZOFRAN ODT) 8 MG disintegrating tablet Take 1 tablet (8 mg total) by mouth every 8 (eight) hours as needed for nausea or vomiting. 20 tablet 0   No current facility-administered medications for this visit.   Allergies  Allergen Reactions   Baclofen Nausea And Vomiting     Review of Systems: All systems  reviewed and negative except where noted in HPI.   No recent labs in Epic  Physical Exam: BP 110/72   Pulse 88   Ht 5\' 5"  (1.651 m)   Wt 227 lb (103 kg)   BMI 37.77 kg/m  Constitutional: Pleasant,well-developed, female in no acute distress. HEENT: Normocephalic and atraumatic. Conjunctivae are normal. No scleral icterus. Neck supple.  Cardiovascular: Normal rate, regular rhythm.  Pulmonary/chest: Effort normal and breath sounds normal. No wheezing, rales or rhonchi. Abdominal: Soft, nondistended, some RUQ to R mid abdomen TTP but Carnett negative. There are no masses palpable.  Extremities: no edema Lymphadenopathy: No cervical adenopathy noted. Neurological: Alert and oriented to person place and time. Skin: Skin is warm and dry. No rashes noted. Psychiatric: Normal mood and affect. Behavior is normal.   ASSESSMENT AND PLAN: 50 year old female here to reestablish care for the following:  Chronic diarrhea Internal hemorrhoids Right upper quadrant pain GERD History of colon polyps  Discussed all of these issues as above.  I thought she had postcholecystectomy related diarrhea and related abdominal wall pain over 3 years ago, colonoscopy showed 10 precancerous polyps, healthy-appearing colon that tested negative for microscopic colitis.  Unfortunately trial of Colestid did not provide benefit.  She is due for surveillance colonoscopy in light of her polyp history, discussed risk  and benefits she wants to proceed.  We will make sure no interval changes causing her bowel symptoms as well.  Given her persistent loose stools, we discussed differential, we will screen her for celiac disease with labs.  Gave her handout on low FODMAP diet to see if that will help with and will give some Imodium to use as needed, can start daily and titrate up as tolerated.  Will await her course, if this does not help or she does not tolerate the regimen can consider other options.  Still quite possible she has abdominal wall pain stemming from her cholecystectomy however characteristics have changed a bit since I have last seen her.  Given her prandial relationship to the pain and her ongoing reflux I offered her an EGD to be done at the same time as her colonoscopy.  She wanted to proceed with that.  We will resume omeprazole dose at 40 mg twice daily for 1 month to see if that will help in the interim.  If her EGD is negative we may consider cross-sectional imaging with a CT scan given her persistent pain, if negative would consider referral to sports medicine for musculoskeletal pain and/or trigger point injection. She agrees.  Plan: - schedule EGD and colonoscopy - start omeprazole 40mg  BID for 1 month trial - lab today for TTG IgA and total IgA level - Low FODMAP diet, counseled and handouts given - start immodium PRM - obtain baseline labs from [facility] - Rheumatology - if EGD negative consider CT scan to evaluate abdominal pain.  Jolly Mango, MD [facility]  CC: Everardo Beals, NP

## 2021-11-24 NOTE — Patient Instructions (Addendum)
If you are age 52 or older, your body mass index should be between 23-30. Your Body mass index is 37.77 kg/m. If this is out of the aforementioned range listed, please consider follow up with your Primary Care Provider.  If you are age 72 or younger, your body mass index should be between 19-25. Your Body mass index is 37.77 kg/m. If this is out of the aformentioned range listed, please consider follow up with your Primary Care Provider.   ________________________________________________________  The [facility] providers would like to encourage you to use [facility] to communicate with providers for non-urgent requests or questions.  Due to long hold times on the telephone, sending your provider a message by [facility] may be a faster and more efficient way to get a response.  Please allow 48 business hours for a response.  Please remember that this is for non-urgent requests.  _______________________________________________________  Kerri Rodriguez have been scheduled for a EGD and colonoscopy with Dr. Havery Moros. Please follow written instructions given to you at your visit today.  Please pick up your prep supplies at the pharmacy within the next 1-3 days. If you use inhalers (even only as needed), please bring them with you on the day of your procedure.  Please go to the lab in the basement of our building to have lab work done as you leave today. Hit "B" for basement when you get on the elevator.  When the doors open the lab is on your left.  We will call you with the results. Thank you.   We will request your records from [facility].  We have sent the following medications to your pharmacy for you to pick up at your convenience: Omeprazole 40 mg: Take twice daily  Please purchase the following medications over the counter and take as directed: Imodium: Take as needed Calmol 4 suppositories: Use as directed  We are giving you a Low-FODMAP diet handout today. FODMAPs are short-chain  carbohydrates (sugars) that are highly fermentable, which means that they go through chemical changes in the GI system, and are poorly absorbed during digestion. When FODMAPs reach the colon (large intestine), bacteria ferment these sugars, turning them into gas and chemicals. This stretches the walls of the colon, causing abdominal bloating, distension, cramping, pain, and/or changes in bowel habits in many patients with IBS. FODMAPs are not unhealthy or harmful, but may exacerbate GI symptoms in those with sensitive GI tracts.  Thank you for entrusting me with your care and for choosing [facility], Dr. Creston Cellar      Thank you for entrusting me with your care and for choosing [facility], Dr. [place] Cellar

## 2021-11-25 LAB — TISSUE TRANSGLUTAMINASE, IGA: (tTG) Ab, IgA: <1 U/mL

## 2021-11-25 LAB — IGA: Immunoglobulin A: 260 mg/dL (ref 47–310)

## 2021-12-26 ENCOUNTER — Telehealth: Payer: Self-pay | Admitting: Gastroenterology

## 2021-12-26 NOTE — Telephone Encounter (Signed)
Labs arrived from [facility]  11/19/21: CRP 0.15 ESR 2 Hgb 15.2, HCT 44.8, MCV 90.0, plt 264, WBC 7.8 AP 83, ALT 42, AST 19, BUN 12, Cr 1.18   Will await her pending EGD / colonoscopy / workup with further recommendations

## 2022-01-02 ENCOUNTER — Encounter: Payer: Self-pay | Admitting: Gastroenterology

## 2022-01-09 ENCOUNTER — Encounter: Payer: Self-pay | Admitting: Gastroenterology

## 2022-01-09 ENCOUNTER — Ambulatory Visit (AMBULATORY_SURGERY_CENTER): Payer: BC Managed Care – PPO | Admitting: Gastroenterology

## 2022-01-09 VITALS — BP 119/61 | HR 66 | Temp 97.7°F | Resp 17 | Ht 65.0 in | Wt 227.0 lb

## 2022-01-09 DIAGNOSIS — R1011 Right upper quadrant pain: Secondary | ICD-10-CM

## 2022-01-09 DIAGNOSIS — K209 Esophagitis, unspecified without bleeding: Secondary | ICD-10-CM

## 2022-01-09 DIAGNOSIS — K319 Disease of stomach and duodenum, unspecified: Secondary | ICD-10-CM | POA: Diagnosis not present

## 2022-01-09 DIAGNOSIS — K219 Gastro-esophageal reflux disease without esophagitis: Secondary | ICD-10-CM

## 2022-01-09 DIAGNOSIS — Z860100 Personal history of colon polyps, unspecified: Secondary | ICD-10-CM

## 2022-01-09 DIAGNOSIS — K529 Noninfective gastroenteritis and colitis, unspecified: Secondary | ICD-10-CM | POA: Diagnosis not present

## 2022-01-09 DIAGNOSIS — D123 Benign neoplasm of transverse colon: Secondary | ICD-10-CM

## 2022-01-09 DIAGNOSIS — K2100 Gastro-esophageal reflux disease with esophagitis, without bleeding: Secondary | ICD-10-CM | POA: Diagnosis not present

## 2022-01-09 DIAGNOSIS — D120 Benign neoplasm of cecum: Secondary | ICD-10-CM | POA: Diagnosis not present

## 2022-01-09 DIAGNOSIS — K254 Chronic or unspecified gastric ulcer with hemorrhage: Secondary | ICD-10-CM

## 2022-01-09 DIAGNOSIS — K2970 Gastritis, unspecified, without bleeding: Secondary | ICD-10-CM

## 2022-01-09 MED ORDER — FLUCONAZOLE 100 MG PO TABS: 100.0000 mg | ORAL_TABLET | Freq: Every day | ORAL | 0 refills | Status: DC

## 2022-01-09 MED ORDER — OMEPRAZOLE 40 MG PO CPDR: 40.0000 mg | DELAYED_RELEASE_CAPSULE | Freq: Two times a day (BID) | ORAL | 0 refills | Status: DC

## 2022-01-09 MED ORDER — SODIUM CHLORIDE 0.9 % IV SOLN: 500.0000 mL | Freq: Once | INTRAVENOUS | Status: DC

## 2022-01-09 NOTE — Patient Instructions (Addendum)
Information on polyps, hemorrhoids, gastritis and esophagitis given to you today.  Await pathology results.  Resume previous diet and medications.   Avoid NSAIDS (Aspirin, Ibuprofen, Aleve, Naproxen), you may use Tylenol as needed.  Fluconazole 400 mg x 1 dose and then 200 mg/day for 2 weeks to treat esophageal candidiasis.  Increase omeprazole to 40 mg twice a day for 6 weeks to treat gastritis.    YOU HAD AN ENDOSCOPIC PROCEDURE TODAY AT [facility]:   Refer to the procedure report that was given to you for any specific questions about what was found during the examination.  If the procedure report does not answer your questions, please call your gastroenterologist to clarify.  If you requested that your care partner not be given the details of your procedure findings, then the procedure report has been included in a sealed envelope for you to review at your convenience later.  YOU SHOULD EXPECT: Some feelings of bloating in the abdomen. Passage of more gas than usual.  Walking can help get rid of the air that was put into your GI tract during the procedure and reduce the bloating. If you had a lower endoscopy (such as a colonoscopy or flexible sigmoidoscopy) you may notice spotting of blood in your stool or on the toilet paper. If you underwent a bowel prep for your procedure, you may not have a normal bowel movement for a few days.  Please Note:  You might notice some irritation and congestion in your nose or some drainage.  This is from the oxygen used during your procedure.  There is no need for concern and it should clear up in a day or so.  SYMPTOMS TO REPORT IMMEDIATELY:  Following lower endoscopy (colonoscopy or flexible sigmoidoscopy):  Excessive amounts of blood in the stool  Significant tenderness or worsening of abdominal pains  Swelling of the abdomen that is new, acute  Fever of 100F or higher  Following upper endoscopy (EGD)  Vomiting of blood or  coffee ground material  New chest pain or pain under the shoulder blades  Painful or persistently difficult swallowing  New shortness of breath  Fever of 100F or higher  Black, tarry-looking stools  For urgent or emergent issues, a gastroenterologist can be reached at any hour by calling [phone]. Do not use MyChart messaging for urgent concerns.    DIET:  We do recommend a small meal at first, but then you may proceed to your regular diet.  Drink plenty of fluids but you should avoid alcoholic beverages for 24 hours.  ACTIVITY:  You should plan to take it easy for the rest of today and you should NOT DRIVE or use heavy machinery until tomorrow (because of the sedation medicines used during the test).    FOLLOW UP: Our staff will call the number listed on your records 48-72 hours following your procedure to check on you and address any questions or concerns that you may have regarding the information given to you following your procedure. If we do not reach you, we will leave a message.  We will attempt to reach you two times.  During this call, we will ask if you have developed any symptoms of COVID 19. If you develop any symptoms (ie: fever, flu-like symptoms, shortness of breath, cough etc.) before then, please call [phone].  If you test positive for Covid 19 in the 2 weeks post procedure, please call and report this information to [place].    If any  biopsies were taken you will be contacted by phone or by letter within the next 1-3 weeks.  Please call us at [phone] if you have not heard about the biopsies in 3 weeks.    SIGNATURES/CONFIDENTIALITY: You and/or your care partner have signed paperwork which will be entered into your electronic medical record.  These signatures attest to the fact that that the information above on your After Visit Summary has been reviewed and is understood.  Full responsibility of the confidentiality of this discharge information lies with you  and/or your care-partner.

## 2022-01-09 NOTE — Progress Notes (Signed)
VS by DT    

## 2022-01-09 NOTE — Progress Notes (Signed)
[facility] History and Physical   Primary Care Physician:  Everardo Beals, NP   Reason for Procedure:   History of colon polyps, chronic diarrhea, RUQ pain, GERD  Plan:    EGD And colonoscopy     HPI: Kerri Rodriguez is a 51 y.o. female  here for colonoscopy and EGD to evaluate symptoms as above. On protonix 40mg  once daily since last visit and working better for her reflux. Otherwise rest of symptoms about the same, continues to have loose stools.  Otherwise feels well without any cardiopulmonary symptoms. Discussed risks / benefits of procedures and anesthesia and she wishes to proceed   Past Medical History:  Diagnosis Date   Allergy    Anxiety    Arthritis    Asthma    "when I was younger and when I smoked"   Chronic headache    COVID 07/2021   Depression    GERD (gastroesophageal reflux disease)    Irritable bowel syndrome (IBS)    Medical history non-contributory    Obesity    Sjogren's disease ([place])     Past Surgical History:  Procedure Laterality Date   CHOLECYSTECTOMY N/A 04/12/2014   Procedure: LAPAROSCOPIC CHOLECYSTECTOMY;  Surgeon: Harl Bowie, MD;  Location: [place];  Service: General;  Laterality: N/A;   FOOT SURGERY Left 2006   fascia tendon clipped   PLANTAR FASCIA SURGERY     WISDOM TOOTH EXTRACTION      Prior to Admission medications   Medication Sig Start Date End Date Taking? Authorizing Provider  buPROPion (WELLBUTRIN XL) 150 MG 24 hr tablet Take 150 mg by mouth daily.   Yes [provider]  hydroxychloroquine (PLAQUENIL) 200 MG tablet Take 200 mg by mouth daily.   Yes [provider]  methotrexate (RHEUMATREX) 2.5 MG tablet Take 2.5 mg by mouth once a week. 8 pills weekly 08/07/20  Yes [provider]  omeprazole (PRILOSEC) 40 MG capsule Take 1 capsule (40 mg total) by mouth 2 (two) times daily. 11/24/21  Yes Bogdan Vivona, Carlota Raspberry, MD  albuterol (VENTOLIN HFA) 108 (90 Base) MCG/ACT inhaler Inhale 1-2  puffs into the lungs every 6 (six) hours as needed for wheezing or shortness of breath. 07/30/21   Hazel Sams, PA-C  fluticasone (FLONASE) 50 MCG/ACT nasal spray SPRAY 1 SPRAY INTO EACH NOSTRIL AT BEDTIME MAY REPEAT IN 12 HOURS IF NEEDED 05/23/18   [provider]  loperamide (IMODIUM A-D) 2 MG tablet Take 1 tablet (2 mg total) by mouth as needed for diarrhea or loose stools. 11/24/21   Aidden Markovic, Carlota Raspberry, MD  ondansetron (ZOFRAN ODT) 8 MG disintegrating tablet Take 1 tablet (8 mg total) by mouth every 8 (eight) hours as needed for nausea or vomiting. 07/30/21   Hazel Sams, PA-C    Current Outpatient Medications  Medication Sig Dispense Refill   buPROPion (WELLBUTRIN XL) 150 MG 24 hr tablet Take 150 mg by mouth daily.     hydroxychloroquine (PLAQUENIL) 200 MG tablet Take 200 mg by mouth daily.     methotrexate (RHEUMATREX) 2.5 MG tablet Take 2.5 mg by mouth once a week. 8 pills weekly     omeprazole (PRILOSEC) 40 MG capsule Take 1 capsule (40 mg total) by mouth 2 (two) times daily. 60 capsule 1   albuterol (VENTOLIN HFA) 108 (90 Base) MCG/ACT inhaler Inhale 1-2 puffs into the lungs every 6 (six) hours as needed for wheezing or shortness of breath. 1 each 0   fluticasone (FLONASE) 50 MCG/ACT nasal  spray SPRAY 1 SPRAY INTO EACH NOSTRIL AT BEDTIME MAY REPEAT IN 12 HOURS IF NEEDED  1   loperamide (IMODIUM A-D) 2 MG tablet Take 1 tablet (2 mg total) by mouth as needed for diarrhea or loose stools. 30 tablet 0   ondansetron (ZOFRAN ODT) 8 MG disintegrating tablet Take 1 tablet (8 mg total) by mouth every 8 (eight) hours as needed for nausea or vomiting. 20 tablet 0   Current Facility-Administered Medications  Medication Dose Route Frequency Provider Last Rate Last Admin   0.9 %  sodium chloride infusion  500 mL Intravenous Once Connelly [place], Carlota Raspberry, MD        Allergies as of 01/09/2022 - Review Complete 01/09/2022  Allergen Reaction Noted   Baclofen Nausea And Vomiting 07/30/2021     Family History  Problem Relation Age of Onset   Colon cancer Father        Dx at age 29   Cancer Sister        liver   Liver cancer Sister        30 months old dx- born with liver ca.  alive age 51.   Cancer Maternal Grandmother        brain   Esophageal cancer Neg Hx    Rectal cancer Neg Hx    Stomach cancer Neg Hx     Social History   Socioeconomic History   Marital status: Married    Spouse name: Not on file   Number of children: 2   Years of education: Not on file   Highest education level: Not on file  Occupational History   Occupation: customer service    Employer: [employer]  Tobacco Use   Smoking status: Former    Packs/day: 0.50    Types: Cigarettes    Quit date: 08/21/2016    Years since quitting: 5.3   Smokeless tobacco: Never  Vaping Use   Vaping Use: Former  Substance and Sexual Activity   Alcohol use: Yes    Alcohol/week: 4.0 standard drinks    Types: 4 Shots of liquor per week    Comment: social   Drug use: No   Sexual activity: Yes    Birth control/protection: None    Comment: have not had a period in 6 years  Other Topics Concern   Not on file  Social History Narrative   Not on file   Social Determinants of Health   Financial Resource Strain: Not on file  Food Insecurity: Not on file  Transportation Needs: Not on file  Physical Activity: Not on file  Stress: Not on file  Social Connections: Not on file  Intimate Partner Violence: Not on file    Review of Systems: All other review of systems negative except as mentioned in the HPI.  Physical Exam: Vital signs BP (!) 140/92    Pulse 71    Temp 97.7 F (36.5 C)    Resp 11    Ht 5\' 5"  (1.651 m)    Wt 227 lb (103 kg)    SpO2 100%    BMI 37.77 kg/m   General:   Alert,  Well-developed, pleasant and cooperative in NAD Lungs:  Clear throughout to auscultation.   Heart:  Regular rate and rhythm Abdomen:  Soft, nontender and nondistended.   Neuro/Psych:  Alert and cooperative. Normal  mood and affect. A and O x 3  Jolly Mango, MD [facility]

## 2022-01-09 NOTE — Op Note (Signed)
[facility] Patient Name: Kerri Rodriguez Procedure Date: 01/09/2022 3:39 PM MRN: [id number] Endoscopist: Remo Lipps P. Zyheir Daft , MD Age: 51 Referring MD:  Date of Birth: 12-26-1970 Gender: Female Account #: [id number] Procedure:                Upper GI endoscopy Indications:              Abdominal pain in the right upper quadrant, post                            cholecystectomy, worsened with eating, history of                            gastro-esophageal reflux disease now on omeprazole                            40mg  / day with better control of symptoms, also                            with chronic loose stools Medicines:                Monitored Anesthesia Care Procedure:                Pre-Anesthesia Assessment:                           - Prior to the procedure, a History and Physical                            was performed, and patient medications and                            allergies were reviewed. The patient's tolerance of                            previous anesthesia was also reviewed. The risks                            and benefits of the procedure and the sedation                            options and risks were discussed with the patient.                            All questions were answered, and informed consent                            was obtained. Prior Anticoagulants: The patient has                            taken no previous anticoagulant or antiplatelet                            agents. ASA Grade Assessment: II - A patient with  mild systemic disease. After reviewing the risks                            and benefits, the patient was deemed in                            satisfactory condition to undergo the procedure.                           After obtaining informed consent, the endoscope was                            passed under direct vision. Throughout the                            procedure, the patient's  blood pressure, pulse, and                            oxygen saturations were monitored continuously. The                            GIF HQ190 #[id number] was introduced through the                            mouth, and advanced to the second part of duodenum.                            The upper GI endoscopy was accomplished without                            difficulty. The patient tolerated the procedure                            well. Scope In: Scope Out: Findings:                 Esophagogastric landmarks were identified: the                            Z-line was found at 39 cm, the gastroesophageal                            junction was found at 39 cm and the upper extent of                            the gastric folds was found at 39 cm from the                            incisors.                           LA Grade A esophagitis was found 39 cm from the  incisors.                           There were scattered white plaques throughout the                            esophagus grossly consistent with esophageal                            candidiasis. The exam of the esophagus was                            otherwise normal.                           Patchy moderate inflammation characterized by                            adherent blood, erythema and friability was found                            in the gastric body and in the gastric antrum.                            Biopsies were taken with a cold forceps for                            Helicobacter pylori testing.                           There was a suspected scar in the antrum most                            consistent with a healed ulcer. The exam of the                            stomach was otherwise normal.                           The duodenal bulb and second portion of the                            duodenum were normal. Biopsies for histology were                            taken with a cold  forceps for evaluation of celiac                            disease. Complications:            No immediate complications. Estimated blood loss:                            Minimal. Estimated Blood Loss:     Estimated blood loss was minimal. Impression:               -  Esophagogastric landmarks identified.                           - LA Grade A reflux esophagitis.                           - Esophageal candidiasis                           - Gastritis. Biopsied.                           - Normal stomach otherwise                           - Normal duodenal bulb and second portion of the                            duodenum. Biopsied. Recommendation:           - Patient has a contact number available for                            emergencies. The signs and symptoms of potential                            delayed complications were discussed with the                            patient. Return to normal activities tomorrow.                            Written discharge instructions were provided to the                            patient.                           - Resume previous diet.                           - Continue present medications.                           - Increase omeprazole to 40mg  twice daily for 6                            weeks to treat gastritis                           - Avoid NSAIDs                           - Fluconazole 400mg  x 1 dose and then 200mg  / day                            for 2 weeks to treat esophageal candidiasis                           -  Await pathology results. Remo Lipps P. Havery Moros, MD 01/09/2022 4:23:22 PM This report has been signed electronically.

## 2022-01-09 NOTE — Progress Notes (Signed)
Called to room to assist during endoscopic procedure.  Patient ID and intended procedure confirmed with present staff. Received instructions for my participation in the procedure from the performing physician.  

## 2022-01-09 NOTE — Progress Notes (Signed)
Report given to PACU, vss 

## 2022-01-09 NOTE — Op Note (Signed)
[facility] Patient Name: Kerri Rodriguez Procedure Date: 01/09/2022 3:35 PM MRN: [id number] Endoscopist: Remo Lipps P. Havery Moros , MD Age: 51 Referring MD:  Date of Birth: 1971/04/29 Gender: Female Account #: [id number] Procedure:                Colonoscopy Indications:              High risk colon cancer surveillance: Personal                            history of colonic polyps - 10 polyps removed                            08/2018, one advanced, (main indication for this                            exam), also with chronic diarrhea not responsive to                            treatment of suspected post cholecystectomy related                            diarrhea Medicines:                Monitored Anesthesia Care Procedure:                Pre-Anesthesia Assessment:                           - Prior to the procedure, a History and Physical                            was performed, and patient medications and                            allergies were reviewed. The patient's tolerance of                            previous anesthesia was also reviewed. The risks                            and benefits of the procedure and the sedation                            options and risks were discussed with the patient.                            All questions were answered, and informed consent                            was obtained. Prior Anticoagulants: The patient has                            taken no previous anticoagulant or antiplatelet  agents. ASA Grade Assessment: II - A patient with                            mild systemic disease. After reviewing the risks                            and benefits, the patient was deemed in                            satisfactory condition to undergo the procedure.                           After obtaining informed consent, the colonoscope                            was passed under direct vision. Throughout the                             procedure, the patient's blood pressure, pulse, and                            oxygen saturations were monitored continuously. The                            Olympus [id number]-[id number]) Colonoscope was                            introduced through the anus and advanced to the the                            terminal ileum, with identification of the                            appendiceal orifice and IC valve. The colonoscopy                            was performed without difficulty. The patient                            tolerated the procedure well. The quality of the                            bowel preparation was good. The terminal ileum,                            ileocecal valve, appendiceal orifice, and rectum                            were photographed. Scope In: 3:55:21 PM Scope Out: 4:09:31 PM Scope Withdrawal Time: 0 hours 9 minutes 43 seconds  Total Procedure Duration: 0 hours 14 minutes 10 seconds  Findings:                 The perianal and digital rectal examinations were  normal.                           The terminal ileum appeared normal.                           A 5 mm polyp was found in the cecum. The polyp was                            sessile. The polyp was removed with a cold snare.                            Resection and retrieval were complete.                           Three sessile polyps were found in the transverse                            colon. The polyps were 3 to 7 mm in size. These                            polyps were removed with a cold snare. Resection                            and retrieval were complete.                           Internal hemorrhoids were found during                            retroflexion. The hemorrhoids were small.                           The exam was otherwise without abnormality.                           Biopsies for histology were taken with a cold                             forceps from the right colon, left colon and                            transverse colon for evaluation of microscopic                            colitis. Complications:            No immediate complications. Estimated blood loss:                            Minimal. Estimated Blood Loss:     Estimated blood loss was minimal. Impression:               - The examined portion of the ileum was normal.                           -  One 5 mm polyp in the cecum, removed with a cold                            snare. Resected and retrieved.                           - Three 3 to 7 mm polyps in the transverse colon,                            removed with a cold snare. Resected and retrieved.                           - Internal hemorrhoids.                           - The examination was otherwise normal.                           - Biopsies were taken with a cold forceps from the                            right colon, left colon and transverse colon for                            evaluation of microscopic colitis. Recommendation:           - Patient has a contact number available for                            emergencies. The signs and symptoms of potential                            delayed complications were discussed with the                            patient. Return to normal activities tomorrow.                            Written discharge instructions were provided to the                            patient.                           - Resume previous diet.                           - Continue present medications.                           - Await pathology results with further                            recommendations Remo Lipps P. Yu Cragun, MD 01/09/2022 4:16:34 PM This report has been signed electronically.

## 2022-01-13 ENCOUNTER — Telehealth: Payer: Self-pay

## 2022-01-13 NOTE — Telephone Encounter (Signed)
Left message on answering on answering machine.

## 2022-01-31 ENCOUNTER — Other Ambulatory Visit: Payer: Self-pay | Admitting: Gastroenterology

## 2022-03-25 ENCOUNTER — Other Ambulatory Visit: Payer: Self-pay | Admitting: Gastroenterology

## 2023-10-04 ENCOUNTER — Encounter (HOSPITAL_COMMUNITY): Payer: Self-pay | Admitting: Emergency Medicine

## 2023-10-04 ENCOUNTER — Ambulatory Visit (HOSPITAL_COMMUNITY)
Admission: EM | Admit: 2023-10-04 | Discharge: 2023-10-04 | Disposition: A | Discharge status: Home / Self Care | Payer: BC Managed Care – PPO

## 2023-10-04 DIAGNOSIS — H1132 Conjunctival hemorrhage, left eye: Secondary | ICD-10-CM

## 2023-10-04 DIAGNOSIS — R030 Elevated blood-pressure reading, without diagnosis of hypertension: Secondary | ICD-10-CM | POA: Diagnosis not present

## 2023-10-04 MED ORDER — HYDROCHLOROTHIAZIDE 25 MG PO TABS: 25.0000 mg | ORAL_TABLET | Freq: Every day | ORAL | 0 refills | Status: AC

## 2023-10-04 NOTE — ED Triage Notes (Signed)
Patient states that she woke up yesterday and noticed that her left eye was red.  Denies any blurred vision out of left eye.  There are some floates with the eye and a HA.  Also patient took her BP with her wrist cuff at home and her BP was 143/87.  Today at work she started feeling jittery and not feeling right, took her BP and it was 139/86.  BP normally runs 117/70.  Denies any CP or SOB.

## 2023-10-04 NOTE — Discharge Instructions (Addendum)
For treatment of mild hypertension, please begin hydrochlorothiazide 1 tablet daily in the morning.  You may find that this medication causes you to you need to urinate more frequently, this typically last week or 2 and then resolves.  Please follow-up with your primary care provider regarding this issue.  I have enclosed some information about the redness in your eye that I hope you find helpful.  Thank you for visiting [facility] today.

## 2023-10-04 NOTE — ED Provider Notes (Signed)
[facility]    CSN: [id number] Arrival date & time: 10/04/23  1815    HISTORY   Chief Complaint  Patient presents with   Eye Problem   HPI Kerri Rodriguez is a pleasant, 52 y.o. female who presents to urgent care today. Patient states that she woke up yesterday and noticed that her left eye was red.  Denies eye pain, foreign body sensation, blurred vision. Endorses history of floaters in left eye and a HA, states her mother in law died 2 weeks ago and has been under stress dealing with this.  Patient states she took her BP this AM with her wrist cuff at home and her BP was 143/87.  Today at work she started feeling jittery and not feeling right, took her BP and it was 139/86, states BP normally runs 117/70.  Denies CP or SOB.  The history is provided by the patient.   Past Medical History:  Diagnosis Date   Allergy    Anxiety    Arthritis    Asthma    "when I was younger and when I smoked"   Chronic headache    COVID 07/2021   Depression    GERD (gastroesophageal reflux disease)    Irritable bowel syndrome (IBS)    Medical history non-contributory    Obesity    Sjogren's disease (HCC)    Patient Active Problem List   Diagnosis Date Noted   Cholecystitis with cholelithiasis 04/12/2014   Past Surgical History:  Procedure Laterality Date   CHOLECYSTECTOMY N/A 04/12/2014   Procedure: LAPAROSCOPIC CHOLECYSTECTOMY;  Surgeon: Shelly Rubenstein, MD;  Location: [facility] OR;  Service: General;  Laterality: N/A;   FOOT SURGERY Left 2006   fascia tendon clipped   PLANTAR FASCIA SURGERY     WISDOM TOOTH EXTRACTION     OB History   No obstetric history on file.    Home Medications    Prior to Admission medications   Medication Sig Start Date End Date Taking? Authorizing Provider  albuterol (VENTOLIN HFA) 108 (90 Base) MCG/ACT inhaler Inhale 1-2 puffs into the lungs every 6 (six) hours as needed for wheezing or shortness of breath. 07/30/21   Rhys Martini, PA-C   buPROPion (WELLBUTRIN XL) 150 MG 24 hr tablet Take 150 mg by mouth daily.    [provider]  fluconazole (DIFLUCAN) 100 MG tablet Take 1 tablet (100 mg total) by mouth daily. Take 400 mg x 1 dose and then 200 mg each day x 2 weeks. 01/09/22   Armbruster, Willaim Rayas, MD  fluticasone (FLONASE) 50 MCG/ACT nasal spray SPRAY 1 SPRAY INTO EACH NOSTRIL AT BEDTIME MAY REPEAT IN 12 HOURS IF NEEDED 05/23/18   [provider]  hydroxychloroquine (PLAQUENIL) 200 MG tablet Take 200 mg by mouth daily.    [provider]  loperamide (IMODIUM A-D) 2 MG tablet Take 1 tablet (2 mg total) by mouth as needed for diarrhea or loose stools. 11/24/21   Armbruster, Willaim Rayas, MD  methotrexate (RHEUMATREX) 2.5 MG tablet Take 2.5 mg by mouth once a week. 8 pills weekly 08/07/20   [provider]  omeprazole (PRILOSEC) 40 MG capsule Take 1 capsule (40 mg total) by mouth daily. 03/25/22   Armbruster, Willaim Rayas, MD  ondansetron (ZOFRAN ODT) 8 MG disintegrating tablet Take 1 tablet (8 mg total) by mouth every 8 (eight) hours as needed for nausea or vomiting. 07/30/21   Rhys Martini, PA-C    Family History Family History  Problem Relation  Age of Onset   Colon cancer Father        Dx at age 52   Cancer Sister        liver   Liver cancer Sister        30 months old dx- born with liver ca.  alive age 51.   Cancer Maternal Grandmother        brain   Esophageal cancer Neg Hx    Rectal cancer Neg Hx    Stomach cancer Neg Hx    Social History Social History   Tobacco Use   Smoking status: Former    Current packs/day: 0.00    Types: Cigarettes    Quit date: 08/21/2016    Years since quitting: 7.1   Smokeless tobacco: Never  Vaping Use   Vaping status: Former  Substance Use Topics   Alcohol use: Yes    Alcohol/week: 4.0 standard drinks of alcohol    Types: 4 Shots of liquor per week    Comment: social   Drug use: No   Allergies   Baclofen  Review of Systems Review of  Systems Pertinent findings revealed after performing a 14 point review of systems has been noted in the history of present illness.  Physical Exam Vital Signs BP 138/74 (BP Location: Left Arm)   Pulse 70   Temp 98.1 F (36.7 C) (Oral)   Resp 18   Ht 5\' 5"  (1.651 m)   Wt 237 lb (107.5 kg)   SpO2 97%   BMI 39.44 kg/m   No data found.  Physical Exam Vitals and nursing note reviewed.  Constitutional:      General: She is not in acute distress.    Appearance: Normal appearance.  HENT:     Head: Normocephalic and atraumatic.  Eyes:     General: Lids are normal.     Conjunctiva/sclera:     Left eye: Hemorrhage present.     Pupils: Pupils are equal, round, and reactive to light.     Funduscopic exam:       Left eye: No hemorrhage, exudate, AV nicking, arteriolar narrowing or papilledema. Red reflex and venous pulsations present.  Cardiovascular:     Rate and Rhythm: Normal rate and regular rhythm.  Pulmonary:     Effort: Pulmonary effort is normal.     Breath sounds: Normal breath sounds.  Musculoskeletal:        General: Normal range of motion.     Cervical back: Normal range of motion and neck supple.  Skin:    General: Skin is warm and dry.  Neurological:     General: No focal deficit present.     Mental Status: She is alert and oriented to person, place, and time. Mental status is at baseline.  Psychiatric:        Mood and Affect: Mood normal.        Behavior: Behavior normal.        Thought Content: Thought content normal.        Judgment: Judgment normal.     Visual Acuity Right Eye Distance: 20 25 (with correction) Left Eye Distance: 20 50 (with correction) Bilateral Distance: 20 25 (with correction)  Right Eye Near:   Left Eye Near:    Bilateral Near:     UC Couse / Diagnostics / Procedures:     Radiology No results found.  Procedures Procedures (including critical care time) EKG  Pending results:  Labs Reviewed - No data to  display  Medications Ordered in UC: Medications - No data to display  UC Diagnoses / Final Clinical Impressions(s)   I have reviewed the triage vital signs and the nursing notes.  Pertinent labs & imaging results that were available during my care of the patient were reviewed by me and considered in my medical decision making (see chart for details).    Final diagnoses:  Elevated blood pressure reading in office without diagnosis of hypertension  Subconjunctival hemorrhage of left eye   Patient provided with a prescription for hydrochlorothiazide to help lower her blood pressure and was advised to follow-up with primary care provider.  Patient was provided with education regarding subconjunctival hemorrhage of her left eye, no intervention required at this time.  Please see discharge instructions below for details of plan of care as provided to patient. ED Prescriptions     Medication Sig Dispense Auth. Provider   hydrochlorothiazide (HYDRODIURIL) 25 MG tablet Take 1 tablet (25 mg total) by mouth daily. 30 tablet Theadora Rama Scales, PA-C      PDMP not reviewed this encounter.  Pending results:  Labs Reviewed - No data to display  Discharge Instructions:   Discharge Instructions      For treatment of mild hypertension, please begin hydrochlorothiazide 1 tablet daily in the morning.  You may find that this medication causes you to you need to urinate more frequently, this typically last week or 2 and then resolves.  Please follow-up with your primary care provider regarding this issue.  I have enclosed some information about the redness in your eye that I hope you find helpful.  Thank you for visiting [facility] today.      Disposition Upon Discharge:  Condition: stable for discharge home  Patient presented with an acute illness with associated systemic symptoms and significant discomfort requiring urgent management. In my opinion, this is a condition  that a prudent lay person (someone who possesses an average knowledge of health and medicine) may potentially expect to result in complications if not addressed urgently such as respiratory distress, impairment of bodily function or dysfunction of bodily organs.   Routine symptom specific, illness specific and/or disease specific instructions were discussed with the patient and/or caregiver at length.   As such, the patient has been evaluated and assessed, work-up was performed and treatment was provided in alignment with urgent care protocols and evidence based medicine.  Patient/parent/caregiver has been advised that the patient may require follow up for further testing and treatment if the symptoms continue in spite of treatment, as clinically indicated and appropriate.  Patient/parent/caregiver has been advised to return to the [facility] or PCP if no better; to PCP or the Emergency Department if new signs and symptoms develop, or if the current signs or symptoms continue to change or worsen for further workup, evaluation and treatment as clinically indicated and appropriate  The patient will follow up with their current PCP if and as advised. If the patient does not currently have a PCP we will assist them in obtaining one.   The patient may need specialty follow up if the symptoms continue, in spite of conservative treatment and management, for further workup, evaluation, consultation and treatment as clinically indicated and appropriate.  Patient/parent/caregiver verbalized understanding and agreement of plan as discussed.  All questions were addressed during visit.  Please see discharge instructions below for further details of plan.  This office note has been dictated using Teaching laboratory technician.  Unfortunately, this method of dictation can  sometimes lead to typographical or grammatical errors.  I apologize for your inconvenience in advance if this occurs.  Please do not hesitate to  reach out to me if clarification is needed.      Theadora Rama Scales, PA-C 10/04/23 1944

## 2024-12-30 ENCOUNTER — Encounter: Payer: Self-pay | Admitting: Gastroenterology
